# Patient Record
Sex: Female | Born: 1945 | Race: Black or African American | Hispanic: No | Marital: Married | Smoking: Former smoker
Health system: Southern US, Community
[De-identification: ages and names within clinical notes are randomized; demographics above are authoritative.]

## PROBLEM LIST (undated history)

## (undated) DIAGNOSIS — E039 Hypothyroidism, unspecified: Secondary | ICD-10-CM

## (undated) DIAGNOSIS — E119 Type 2 diabetes mellitus without complications: Secondary | ICD-10-CM

## (undated) DIAGNOSIS — I10 Essential (primary) hypertension: Secondary | ICD-10-CM

## (undated) HISTORY — PX: KNEE ARTHROSCOPY: SUR90

## (undated) HISTORY — PX: ULNAR NERVE TRANSPOSITION: SHX2595

## (undated) HISTORY — PX: CHOLECYSTECTOMY: SHX55

## (undated) HISTORY — PX: TUBAL LIGATION: SHX77

---

## 2015-04-24 HISTORY — PX: LUMBAR SPINE SURGERY: SHX701

## 2018-02-05 ENCOUNTER — Other Ambulatory Visit: Payer: Self-pay

## 2018-02-05 ENCOUNTER — Encounter (HOSPITAL_COMMUNITY): Payer: Self-pay

## 2018-02-05 ENCOUNTER — Emergency Department (HOSPITAL_COMMUNITY): Payer: Medicare Other

## 2018-02-05 ENCOUNTER — Inpatient Hospital Stay (HOSPITAL_COMMUNITY)
Admission: EM | Admit: 2018-02-05 | Discharge: 2018-02-09 | DRG: 871 | Disposition: A | Discharge status: Home / Self Care | Payer: Medicare Other | Attending: Family Medicine | Admitting: Family Medicine

## 2018-02-05 DIAGNOSIS — E039 Hypothyroidism, unspecified: Secondary | ICD-10-CM | POA: Diagnosis present

## 2018-02-05 DIAGNOSIS — E119 Type 2 diabetes mellitus without complications: Secondary | ICD-10-CM | POA: Diagnosis not present

## 2018-02-05 DIAGNOSIS — R410 Disorientation, unspecified: Secondary | ICD-10-CM | POA: Diagnosis present

## 2018-02-05 DIAGNOSIS — N179 Acute kidney failure, unspecified: Secondary | ICD-10-CM | POA: Diagnosis present

## 2018-02-05 DIAGNOSIS — Z79890 Hormone replacement therapy (postmenopausal): Secondary | ICD-10-CM

## 2018-02-05 DIAGNOSIS — Z883 Allergy status to other anti-infective agents status: Secondary | ICD-10-CM

## 2018-02-05 DIAGNOSIS — R059 Cough, unspecified: Secondary | ICD-10-CM

## 2018-02-05 DIAGNOSIS — J150 Pneumonia due to Klebsiella pneumoniae: Secondary | ICD-10-CM | POA: Diagnosis present

## 2018-02-05 DIAGNOSIS — R05 Cough: Secondary | ICD-10-CM

## 2018-02-05 DIAGNOSIS — N3001 Acute cystitis with hematuria: Secondary | ICD-10-CM | POA: Diagnosis not present

## 2018-02-05 DIAGNOSIS — E1165 Type 2 diabetes mellitus with hyperglycemia: Secondary | ICD-10-CM | POA: Diagnosis present

## 2018-02-05 DIAGNOSIS — Z79899 Other long term (current) drug therapy: Secondary | ICD-10-CM

## 2018-02-05 DIAGNOSIS — A419 Sepsis, unspecified organism: Secondary | ICD-10-CM | POA: Diagnosis present

## 2018-02-05 DIAGNOSIS — I10 Essential (primary) hypertension: Secondary | ICD-10-CM | POA: Diagnosis present

## 2018-02-05 DIAGNOSIS — Z7984 Long term (current) use of oral hypoglycemic drugs: Secondary | ICD-10-CM

## 2018-02-05 DIAGNOSIS — N12 Tubulo-interstitial nephritis, not specified as acute or chronic: Secondary | ICD-10-CM | POA: Diagnosis present

## 2018-02-05 DIAGNOSIS — N390 Urinary tract infection, site not specified: Secondary | ICD-10-CM | POA: Diagnosis not present

## 2018-02-05 DIAGNOSIS — A4159 Other Gram-negative sepsis: Principal | ICD-10-CM | POA: Diagnosis present

## 2018-02-05 DIAGNOSIS — R509 Fever, unspecified: Secondary | ICD-10-CM

## 2018-02-05 DIAGNOSIS — J13 Pneumonia due to Streptococcus pneumoniae: Secondary | ICD-10-CM | POA: Diagnosis not present

## 2018-02-05 HISTORY — DX: Essential (primary) hypertension: I10

## 2018-02-05 HISTORY — DX: Type 2 diabetes mellitus without complications: E11.9

## 2018-02-05 HISTORY — DX: Hypothyroidism, unspecified: E03.9

## 2018-02-05 LAB — CBC WITH DIFFERENTIAL/PLATELET
ABS IMMATURE GRANULOCYTES: 0.09 10*3/uL — AB (ref 0.00–0.07)
Basophils Absolute: 0 10*3/uL (ref 0.0–0.1)
Basophils Relative: 0 %
EOS ABS: 0 10*3/uL (ref 0.0–0.5)
Eosinophils Relative: 0 %
HEMATOCRIT: 31.9 % — AB (ref 36.0–46.0)
HEMOGLOBIN: 10.4 g/dL — AB (ref 12.0–15.0)
IMMATURE GRANULOCYTES: 1 %
LYMPHS ABS: 1.4 10*3/uL (ref 0.7–4.0)
LYMPHS PCT: 10 %
MCH: 27.5 pg (ref 26.0–34.0)
MCHC: 32.6 g/dL (ref 30.0–36.0)
MCV: 84.4 fL (ref 80.0–100.0)
Monocytes Absolute: 1.6 10*3/uL — ABNORMAL HIGH (ref 0.1–1.0)
Monocytes Relative: 11 %
NEUTROS PCT: 78 %
Neutro Abs: 11.2 10*3/uL — ABNORMAL HIGH (ref 1.7–7.7)
Platelets: 297 10*3/uL (ref 150–400)
RBC: 3.78 MIL/uL — ABNORMAL LOW (ref 3.87–5.11)
RDW: 14 % (ref 11.5–15.5)
WBC: 14.3 10*3/uL — ABNORMAL HIGH (ref 4.0–10.5)
nRBC: 0 % (ref 0.0–0.2)

## 2018-02-05 LAB — INFLUENZA PANEL BY PCR (TYPE A & B)
Influenza A By PCR: NEGATIVE
Influenza B By PCR: NEGATIVE

## 2018-02-05 LAB — GLUCOSE, CAPILLARY: Glucose-Capillary: 310 mg/dL — ABNORMAL HIGH (ref 70–99)

## 2018-02-05 LAB — URINALYSIS, ROUTINE W REFLEX MICROSCOPIC
BILIRUBIN URINE: NEGATIVE
Glucose, UA: >=500 mg/dL — AB
KETONES UR: 5 mg/dL — AB
NITRITE: NEGATIVE
Protein, ur: 100 mg/dL — AB
Specific Gravity, Urine: 1.016 (ref 1.005–1.030)
pH: 5 (ref 5.0–8.0)

## 2018-02-05 LAB — COMPREHENSIVE METABOLIC PANEL
ALBUMIN: 3 g/dL — AB (ref 3.5–5.0)
ALK PHOS: 92 U/L (ref 38–126)
ALT: 21 U/L (ref 0–44)
ANION GAP: 12 (ref 5–15)
AST: 17 U/L (ref 15–41)
BUN: 51 mg/dL — ABNORMAL HIGH (ref 8–23)
CALCIUM: 8.7 mg/dL — AB (ref 8.9–10.3)
CO2: 23 mmol/L (ref 22–32)
Chloride: 100 mmol/L (ref 98–111)
Creatinine, Ser: 1.74 mg/dL — ABNORMAL HIGH (ref 0.44–1.00)
GFR calc Af Amer: 32 mL/min — ABNORMAL LOW (ref >60)
GFR calc non Af Amer: 28 mL/min — ABNORMAL LOW (ref >60)
Glucose, Bld: 360 mg/dL — ABNORMAL HIGH (ref 70–99)
Potassium: 4.2 mmol/L (ref 3.5–5.1)
SODIUM: 135 mmol/L (ref 135–145)
Total Bilirubin: 0.8 mg/dL (ref 0.3–1.2)
Total Protein: 7.1 g/dL (ref 6.5–8.1)

## 2018-02-05 LAB — TROPONIN I

## 2018-02-05 LAB — I-STAT CG4 LACTIC ACID, ED: Lactic Acid, Venous: 0.92 mmol/L (ref 0.5–1.9)

## 2018-02-05 LAB — LIPASE, BLOOD: LIPASE: 38 U/L (ref 11–51)

## 2018-02-05 MED ORDER — LEVOTHYROXINE SODIUM 88 MCG PO TABS
88.0000 ug | ORAL_TABLET | Freq: Every day | ORAL | Status: DC
  Administered 2018-02-06 – 2018-02-09 (×4): 88 ug via ORAL
  Filled 2018-02-05 (×4): qty 1

## 2018-02-05 MED ORDER — ADULT MULTIVITAMIN W/MINERALS CH
1.0000 | ORAL_TABLET | Freq: Every day | ORAL | Status: DC
  Administered 2018-02-06 – 2018-02-09 (×4): 1 via ORAL
  Filled 2018-02-05 (×4): qty 1

## 2018-02-05 MED ORDER — EZETIMIBE 10 MG PO TABS
10.0000 mg | ORAL_TABLET | Freq: Every day | ORAL | Status: DC
  Administered 2018-02-06 – 2018-02-09 (×4): 10 mg via ORAL
  Filled 2018-02-05 (×4): qty 1

## 2018-02-05 MED ORDER — ACETAMINOPHEN 325 MG PO TABS
650.0000 mg | ORAL_TABLET | Freq: Four times a day (QID) | ORAL | Status: DC | PRN
  Administered 2018-02-06 – 2018-02-07 (×3): 650 mg via ORAL
  Filled 2018-02-05 (×3): qty 2

## 2018-02-05 MED ORDER — SIMVASTATIN 40 MG PO TABS
40.0000 mg | ORAL_TABLET | Freq: Every day | ORAL | Status: DC
  Administered 2018-02-05 – 2018-02-08 (×4): 40 mg via ORAL
  Filled 2018-02-05 (×4): qty 1

## 2018-02-05 MED ORDER — GUAIFENESIN-DM 100-10 MG/5ML PO SYRP
5.0000 mL | ORAL_SOLUTION | ORAL | Status: DC | PRN
  Administered 2018-02-06 – 2018-02-08 (×7): 5 mL via ORAL
  Filled 2018-02-05 (×8): qty 10

## 2018-02-05 MED ORDER — ONDANSETRON HCL 4 MG PO TABS: 4.0000 mg | ORAL_TABLET | Freq: Four times a day (QID) | ORAL | Status: DC | PRN

## 2018-02-05 MED ORDER — INSULIN ASPART 100 UNIT/ML ~~LOC~~ SOLN
0.0000 [IU] | Freq: Three times a day (TID) | SUBCUTANEOUS | Status: DC
  Administered 2018-02-06: 11 [IU] via SUBCUTANEOUS
  Administered 2018-02-06: 8 [IU] via SUBCUTANEOUS

## 2018-02-05 MED ORDER — HEPARIN SODIUM (PORCINE) 5000 UNIT/ML IJ SOLN
5000.0000 [IU] | Freq: Three times a day (TID) | INTRAMUSCULAR | Status: DC
  Administered 2018-02-05 – 2018-02-09 (×11): 5000 [IU] via SUBCUTANEOUS
  Filled 2018-02-05 (×11): qty 1

## 2018-02-05 MED ORDER — SODIUM CHLORIDE 0.9 % IV SOLN
INTRAVENOUS | Status: DC
  Administered 2018-02-05 – 2018-02-09 (×6): via INTRAVENOUS

## 2018-02-05 MED ORDER — INSULIN ASPART 100 UNIT/ML ~~LOC~~ SOLN
5.0000 [IU] | Freq: Once | SUBCUTANEOUS | Status: AC
  Administered 2018-02-05: 5 [IU] via SUBCUTANEOUS

## 2018-02-05 MED ORDER — SODIUM CHLORIDE 0.9 % IV SOLN
1.0000 g | Freq: Once | INTRAVENOUS | Status: DC
  Filled 2018-02-05: qty 10

## 2018-02-05 MED ORDER — ONDANSETRON HCL 4 MG/2ML IJ SOLN: 4.0000 mg | Freq: Four times a day (QID) | INTRAMUSCULAR | Status: DC | PRN

## 2018-02-05 MED ORDER — SODIUM CHLORIDE 0.9 % IV SOLN
1.0000 g | INTRAVENOUS | Status: DC
  Administered 2018-02-05: 1 g via INTRAVENOUS
  Filled 2018-02-05: qty 10

## 2018-02-05 MED ORDER — ACETAMINOPHEN 650 MG RE SUPP: 650.0000 mg | Freq: Four times a day (QID) | RECTAL | Status: DC | PRN

## 2018-02-05 MED ORDER — SODIUM CHLORIDE 0.9 % IV BOLUS
1000.0000 mL | Freq: Once | INTRAVENOUS | Status: AC
  Administered 2018-02-05: 1000 mL via INTRAVENOUS

## 2018-02-05 NOTE — H&P (Signed)
History and Physical    Patty Jones ONG:[id number] DOB: 09-29-1945 DOA: 02/05/2018  PCP: System, Pcp Not In  Patient coming from: Home  I have personally briefly reviewed patient's old medical records in [facility]  Chief Complaint: Fever, fatigue  HPI: Patty Jones is a 72 y.o. female with medical history significant of DM2, HTN.  Patient presents to the ED for evaluation of fever, fatigue, AMS described as slow to respond.  Decreased PO intake since Friday.  No dysuria.  Not tried any meds at home for fever. BGLs in the 300s for past 2 days.  Chronic cough that seems worse since symptom onset.   ED Course: CXR neg, but UA demonstrates likely UTI with pyuria, many bacteremia, large LE, neg nitrites.  Creat of 1.7 BUN 51.  WBC 14.3.  Tm 101.5  No flank, back, nor abd pain.   Review of Systems: As per HPI otherwise 10 point review of systems negative.   Past Medical History:  Diagnosis Date  . Diabetes mellitus without complication (HCC)   . HTN (hypertension)   . Hypothyroidism     Past Surgical History:  Procedure Laterality Date  . CESAREAN SECTION     X3  . CHOLECYSTECTOMY    . KNEE ARTHROSCOPY    . LUMBAR SPINE SURGERY  2017   L4-L5 XLIF  . TUBAL LIGATION    . ULNAR NERVE TRANSPOSITION       has no tobacco, alcohol, and drug history on file.  No Known Allergies  Family History  Problem Relation Age of Onset  . Breast cancer Mother   . Diabetes Mellitus II Mother   . Prostate cancer Brother      Prior to Admission medications   Medication Sig Start Date End Date Taking? Authorizing Provider  ezetimibe (ZETIA) 10 MG tablet Take 10 mg by mouth daily. 01/18/18  Yes [provider]  glipiZIDE (GLUCOTROL XL) 5 MG 24 hr tablet Take 10 mg by mouth daily. 01/18/18  Yes [provider]  levothyroxine (SYNTHROID, LEVOTHROID) 88 MCG tablet Take 88 mcg by mouth daily. 11/26/17  Yes [provider]  lisinopril (PRINIVIL,ZESTRIL) 20  MG tablet Take 20 mg by mouth daily. 12/26/17  Yes [provider]  meloxicam (MOBIC) 7.5 MG tablet Take 7.5 mg by mouth daily. 01/28/18  Yes [provider]  metFORMIN (GLUCOPHAGE) 500 MG tablet Take 500 mg by mouth 2 (two) times daily. 11/21/17  Yes [provider]  Misc Natural Products (JOINT SUPPORT) CAPS Take 1 capsule by mouth daily.   Yes [provider]  Multiple Vitamins-Minerals (MULTIVITAMIN WOMEN 50+) TABS Take 1 tablet by mouth daily.   Yes [provider]  Omega-3 Fatty Acids (FISH OIL) 1000 MG CAPS Take 1,000 mg by mouth daily.   Yes [provider]  simvastatin (ZOCOR) 40 MG tablet Take 40 mg by mouth at bedtime. 12/28/17  Yes [provider]  TRADJENTA 5 MG TABS tablet Take 5 mg by mouth daily. 11/25/17  Yes [provider]    Physical Exam: Vitals:   02/05/18 2014 02/05/18 2243  BP: 132/64 122/64  Pulse: (!) 106 91  Resp: 18 (!) 22  Temp: (!) 101.5 F (38.6 C) 99.1 F (37.3 C)  TempSrc: Oral Oral  SpO2: 93% 94%    Constitutional: NAD, calm, comfortable Eyes: PERRL, lids and conjunctivae normal ENMT: Mucous membranes are moist. Posterior pharynx clear of any exudate or lesions.Normal dentition.  Neck: normal, supple, no masses, no thyromegaly Respiratory:  clear to auscultation bilaterally, no wheezing, no crackles. Normal respiratory effort. No accessory muscle use.  Cardiovascular: Regular rate and rhythm, no murmurs / rubs / gallops. No extremity edema. 2+ pedal pulses. No carotid bruits.  Abdomen: no tenderness, no masses palpated. No hepatosplenomegaly. Bowel sounds positive.  Musculoskeletal: no clubbing / cyanosis. No joint deformity upper and lower extremities. Good ROM, no contractures. Normal muscle tone.  Skin: no rashes, lesions, ulcers. No induration Neurologic: CN 2-12 grossly intact. Sensation intact, DTR normal. Strength 5/5 in all 4.  Psychiatric: Normal judgment and insight. Alert and  oriented x 3. Normal mood.    Labs on Admission: I have personally reviewed following labs and imaging studies  CBC: Recent Labs  Lab 02/05/18 2056  WBC 14.3*  NEUTROABS 11.2*  HGB 10.4*  HCT 31.9*  MCV 84.4  PLT 297   Basic Metabolic Panel: Recent Labs  Lab 02/05/18 2056  NA 135  K 4.2  CL 100  CO2 23  GLUCOSE 360*  BUN 51*  CREATININE 1.74*  CALCIUM 8.7*   GFR: CrCl cannot be calculated (Unknown ideal weight.). Liver Function Tests: Recent Labs  Lab 02/05/18 2056  AST 17  ALT 21  ALKPHOS 92  BILITOT 0.8  PROT 7.1  ALBUMIN 3.0*   Recent Labs  Lab 02/05/18 2056  LIPASE 38   No results for input(s): AMMONIA in the last 168 hours. Coagulation Profile: No results for input(s): INR, PROTIME in the last 168 hours. Cardiac Enzymes: Recent Labs  Lab 02/05/18 2056  TROPONINI <0.03   BNP (last 3 results) No results for input(s): PROBNP in the last 8760 hours. HbA1C: No results for input(s): HGBA1C in the last 72 hours. CBG: No results for input(s): GLUCAP in the last 168 hours. Lipid Profile: No results for input(s): CHOL, HDL, LDLCALC, TRIG, CHOLHDL, LDLDIRECT in the last 72 hours. Thyroid Function Tests: No results for input(s): TSH, T4TOTAL, FREET4, T3FREE, THYROIDAB in the last 72 hours. Anemia Panel: No results for input(s): VITAMINB12, FOLATE, FERRITIN, TIBC, IRON, RETICCTPCT in the last 72 hours. Urine analysis:    Component Value Date/Time   COLORURINE AMBER (A) 02/05/2018 2130   APPEARANCEUR CLOUDY (A) 02/05/2018 2130   LABSPEC 1.016 02/05/2018 2130   PHURINE 5.0 02/05/2018 2130   GLUCOSEU >=500 (A) 02/05/2018 2130   HGBUR LARGE (A) 02/05/2018 2130   BILIRUBINUR NEGATIVE 02/05/2018 2130   KETONESUR 5 (A) 02/05/2018 2130   PROTEINUR 100 (A) 02/05/2018 2130   NITRITE NEGATIVE 02/05/2018 2130   LEUKOCYTESUR LARGE (A) 02/05/2018 2130    Radiological Exams on Admission: Dg Chest 2 View  Result Date: 02/05/2018 CLINICAL DATA:  Fever  and cough with lethargy since Friday. EXAM: CHEST - 2 VIEW COMPARISON:  None. FINDINGS: Low lung volumes with bibasilar subsegmental atelectasis. Heart is normal in size. Mild aortic atherosclerosis is noted at the arch without aneurysm. No acute osseous abnormality is seen. IMPRESSION: Low lung volumes with streaky subsegmental atelectasis noted at each base. Electronically Signed   By: Tollie Eth M.D.   On: 02/05/2018 21:58    EKG: Independently reviewed.  Assessment/Plan Principal Problem:   Sepsis secondary to UTI [facility]) Active Problems:   AKI (acute kidney injury) (HCC)   DM2 (diabetes mellitus, type 2) (HCC)   HTN (hypertension)    1. Sepsis secondary to UTI - 1. Rocephin 2. UCx pending 3. IVF: 1L bolus in ED and NS at 125 cc/hr 4. Repeat CBC/BMP in AM 2. AKI 1. Hold lisinopril 2. IVF as above  3. Intake and output 4. Repeat BMP in AM 3. DM2 - 1. Hold PO hypoglycemics 2. Mod scale SSI AC 4. HTN - 1. Holding lisinopril  DVT prophylaxis: Heparin [place] Code Status: Full Family Communication: Family at bedside Disposition Plan: home after admit Consults called: None Admission status: Admit to inpatient  Severity of Illness: The appropriate patient status for this patient is INPATIENT. Inpatient status is judged to be reasonable and necessary in order to provide the required intensity of service to ensure the patient's safety. The patient's presenting symptoms, physical exam findings, and initial radiographic and laboratory data in the context of their chronic comorbidities is felt to place them at high risk for further clinical deterioration. Furthermore, it is not anticipated that the patient will be medically stable for discharge from the hospital within 2 midnights of admission. The following factors support the patient status of inpatient.   " The patient's presenting symptoms include AMS, fever. " The worrisome physical exam findings include AMS, slower to respond, fever,  tachycardia. " The initial radiographic and laboratory data are worrisome because of UTI, AKI, leukocytosis. " The chronic co-morbidities include DM2, HTN.   * I certify that at the point of admission it is my clinical judgment that the patient will require inpatient hospital care spanning beyond 2 midnights from the point of admission due to high intensity of service, high risk for further deterioration and high frequency of surveillance required.Hillary Bow DO [facility] Pager [phone] Only works nights!  If 7AM-7PM, please contact the primary day team physician taking care of patient  [email or web address] Password [email or web address]  02/05/2018, 11:02 PM

## 2018-02-05 NOTE — ED Notes (Signed)
Spoke with provider long, verbal to d/c 2nd lactic acid

## 2018-02-05 NOTE — ED Provider Notes (Signed)
Emergency Department Provider Note   I have reviewed the triage vital signs and the nursing notes.   HISTORY  Chief Complaint Fever; Fatigue; and Loss of Appetite   HPI Patty Jones is a 72 y.o. female with PMH of DM presents to the emergency department for evaluation of fever, fatigue, and cough.  Family states that the patient has been more slow to answer and fatigued.  He states she has not been eating since Friday.  The patient denies any abdominal pain, chest pain, or shortness of breath.  She denies any UTI symptoms.  She has not been taking Tylenol or Motrin for fever.  Family noticed that her blood sugars have been elevated to the 300s over the past 2 days. They note that the patient has a chronic cough but patient states this seems worse since fever onset.    Past Medical History:  Diagnosis Date  . Diabetes mellitus without complication (HCC)   . HTN (hypertension)   . Hypothyroidism     Patient Active Problem List   Diagnosis Date Noted  . Sepsis secondary to UTI (HCC) 02/05/2018  . AKI (acute kidney injury) (HCC) 02/05/2018  . DM2 (diabetes mellitus, type 2) (HCC) 02/05/2018  . HTN (hypertension) 02/05/2018    Past Surgical History:  Procedure Laterality Date  . CESAREAN SECTION     X3  . CHOLECYSTECTOMY    . KNEE ARTHROSCOPY    . LUMBAR SPINE SURGERY  2017   L4-L5 XLIF  . TUBAL LIGATION    . ULNAR NERVE TRANSPOSITION       Allergies Patient has no known allergies.  Family History  Problem Relation Age of Onset  . Breast cancer Mother   . Diabetes Mellitus II Mother   . Prostate cancer Brother     Social History Social History   Tobacco Use  . Smoking status: Not on file  Substance Use Topics  . Alcohol use: Not on file  . Drug use: Not on file    Review of Systems  Constitutional: Positive fever/chills. Positive fatigue and generalized weakness.  Eyes: No visual changes. ENT: No sore throat. Cardiovascular: Denies chest  pain. Respiratory: Denies shortness of breath. Positive cough.  Gastrointestinal: No abdominal pain.  No nausea, no vomiting.  No diarrhea.  No constipation. Patient with loss of appetite.  Genitourinary: Negative for dysuria. Musculoskeletal: Negative for back pain. Skin: Negative for rash. Neurological: Negative for headaches, focal weakness or numbness.  10-point ROS otherwise negative.  ____________________________________________   PHYSICAL EXAM:  VITAL SIGNS: ED Triage Vitals  Enc Vitals Group     BP 02/05/18 2014 132/64     Pulse Rate 02/05/18 2014 (!) 106     Resp 02/05/18 2014 18     Temp 02/05/18 2014 (!) 101.5 F (38.6 C)     Temp Source 02/05/18 2014 Oral     SpO2 02/05/18 2014 93 %     Pain Score 02/05/18 2021 0    Constitutional: Alert and oriented. Patient appears fatigued but able to provide a full and appropriate history.  Eyes: Conjunctivae are normal.  Head: Atraumatic. Nose: No congestion/rhinnorhea. Mouth/Throat: Mucous membranes are dry. Oropharynx non-erythematous. Neck: No stridor.  No meningeal signs.  Cardiovascular: Tachycardia. Good peripheral circulation. Grossly normal heart sounds.   Respiratory: Normal respiratory effort.  No retractions. Lungs CTAB. Gastrointestinal: Soft and nontender. No distention.  Musculoskeletal: No lower extremity tenderness nor edema. No gross deformities of extremities. Neurologic:  Normal speech and language. No gross focal  neurologic deficits are appreciated.  Skin:  Skin is warm, dry and intact. No rash noted.  ____________________________________________   LABS (all labs ordered are listed, but only abnormal results are displayed)  Labs Reviewed  COMPREHENSIVE METABOLIC PANEL - Abnormal; Notable for the following components:      Result Value   Glucose, Bld 360 (*)    BUN 51 (*)    Creatinine, Ser 1.74 (*)    Calcium 8.7 (*)    Albumin 3.0 (*)    GFR calc non Af Amer 28 (*)    GFR calc Af Amer 32 (*)     All other components within normal limits  CBC WITH DIFFERENTIAL/PLATELET - Abnormal; Notable for the following components:   WBC 14.3 (*)    RBC 3.78 (*)    Hemoglobin 10.4 (*)    HCT 31.9 (*)    Neutro Abs 11.2 (*)    Monocytes Absolute 1.6 (*)    Abs Immature Granulocytes 0.09 (*)    All other components within normal limits  URINALYSIS, ROUTINE W REFLEX MICROSCOPIC - Abnormal; Notable for the following components:   Color, Urine AMBER (*)    APPearance CLOUDY (*)    Glucose, UA >=500 (*)    Hgb urine dipstick LARGE (*)    Ketones, ur 5 (*)    Protein, ur 100 (*)    Leukocytes, UA LARGE (*)    RBC / HPF >50 (*)    WBC, UA >50 (*)    Bacteria, UA MANY (*)    All other components within normal limits  CULTURE, BLOOD (ROUTINE X 2)  CULTURE, BLOOD (ROUTINE X 2)  URINE CULTURE  LIPASE, BLOOD  TROPONIN I  INFLUENZA PANEL BY PCR (TYPE A & B)  BASIC METABOLIC PANEL  CBC  I-STAT CG4 LACTIC ACID, ED   ____________________________________________  EKG   EKG Interpretation  Date/Time:  Wednesday February 05 2018 21:08:30 EDT Ventricular Rate:  96 PR Interval:    QRS Duration: 82 QT Interval:  341 QTC Calculation: 431 R Axis:   58 Text Interpretation:  Sinus rhythm Abnormal R-wave progression, early transition No STEMI.  Confirmed by Alona Bene [phone]) on 02/05/2018 9:35:49 PM       ____________________________________________  RADIOLOGY  Dg Chest 2 View  Result Date: 02/05/2018 CLINICAL DATA:  Fever and cough with lethargy since Friday. EXAM: CHEST - 2 VIEW COMPARISON:  None. FINDINGS: Low lung volumes with bibasilar subsegmental atelectasis. Heart is normal in size. Mild aortic atherosclerosis is noted at the arch without aneurysm. No acute osseous abnormality is seen. IMPRESSION: Low lung volumes with streaky subsegmental atelectasis noted at each base. Electronically Signed   By: Tollie Eth M.D.   On: 02/05/2018 21:58     ____________________________________________   PROCEDURES  Procedure(s) performed:   Procedures  None ____________________________________________   INITIAL IMPRESSION / ASSESSMENT AND PLAN / ED COURSE  Pertinent labs & imaging results that were available during my care of the patient were reviewed by me and considered in my medical decision making (see chart for details).  Patient presents to the emergency department for evaluation of fever, fatigue, cough, poor appetite.  No clear section source on exam.  She does not have a rash or ulcerations.  Plan for septic work-up.  Will add influenza testing with cough.  Flu negative. Labs with leukocytosis and likely AKI. UA with UTI. Given fatigue and AKI plan for admit. Patient given 1L IVF bolus. No sepsis.   Discussed patient's case with  Hospitalist, Dr. Julian Reil to request admission. Patient and family (if present) updated with plan. Care transferred to Hospitalist service.  I reviewed all nursing notes, vitals, pertinent old records, EKGs, labs, imaging (as available).   ____________________________________________  FINAL CLINICAL IMPRESSION(S) / ED DIAGNOSES  Final diagnoses:  Acute cystitis with hematuria  Fever, unspecified fever cause     MEDICATIONS GIVEN DURING THIS VISIT:  Medications  insulin aspart (novoLOG) injection 0-15 Units (has no administration in time range)  levothyroxine (SYNTHROID, LEVOTHROID) tablet 88 mcg (has no administration in time range)  ezetimibe (ZETIA) tablet 10 mg (has no administration in time range)  MULTIVITAMIN WOMEN 50+ TABS 1 tablet (has no administration in time range)  simvastatin (ZOCOR) tablet 40 mg (has no administration in time range)  cefTRIAXone (ROCEPHIN) 1 g in sodium chloride 0.9 % 100 mL IVPB (1 g Intravenous New Bag/Given 02/05/18 2240)  0.9 %  sodium chloride infusion (has no administration in time range)  acetaminophen (TYLENOL) tablet 650 mg (has no  administration in time range)    Or  acetaminophen (TYLENOL) suppository 650 mg (has no administration in time range)  ondansetron (ZOFRAN) tablet 4 mg (has no administration in time range)    Or  ondansetron (ZOFRAN) injection 4 mg (has no administration in time range)  heparin injection 5,000 Units (has no administration in time range)  sodium chloride 0.9 % bolus 1,000 mL (1,000 mLs Intravenous New Bag/Given 02/05/18 2110)    Note:  This document was prepared using Dragon voice recognition software and may include unintentional dictation errors.  Alona Bene, MD Emergency Medicine    Long, Arlyss Repress, MD 02/05/18 [phone]

## 2018-02-05 NOTE — ED Notes (Signed)
Patient transported to X-ray 

## 2018-02-05 NOTE — ED Triage Notes (Signed)
Pt daughter reports patient arrived at her house today and has been lethargic, febrile, and not eating since Friday. Patient is lethargic and slow to answer in triage. Daughter reports a Tmax of 103 at home. She states that she did not give her mother tylenol because she is a diabetic and she "doesn't just give her anything." Pt A&Ox3.

## 2018-02-05 NOTE — ED Notes (Signed)
ED TO INPATIENT HANDOFF REPORT  Name/Age/Gender Patty Jones 72 y.o. female  Code Status    Code Status Orders  (From admission, onward)         Start     Ordered   02/05/18 2233  Full code  Continuous     02/05/18 2233        Code Status History    This patient has a current code status but no historical code status.      Home/SNF/Other Home  Chief Complaint [phone]); Hyperglycemia; Not Eating  Level of Care/Admitting Diagnosis ED Disposition    ED Disposition Condition Comment   Admit  Hospital Area: [place] [[id number]]  Level of Care: Med-Surg [16]  Diagnosis: Sepsis secondary to UTI [facility]) [863817]  Admitting Physician: Doreatha Massed  Attending Physician: Etta Quill [phone]  Estimated length of stay: past midnight tomorrow  Certification:: I certify this patient will need inpatient services for at least 2 midnights  PT Class (Do Not Modify): Inpatient [101]  PT Acc Code (Do Not Modify): Private [1]       Medical History Past Medical History:  Diagnosis Date  . Diabetes mellitus without complication ([place])   . HTN (hypertension)   . Hypothyroidism     Allergies No Known Allergies  IV Location/Drains/Wounds Patient Lines/Drains/Airways Status   Active Line/Drains/Airways    Name:   Placement date:   Placement time:   Site:   Days:   Peripheral IV 02/05/18 Right Forearm   02/05/18    2049    Forearm   less than 1          Labs/Imaging Results for orders placed or performed during the hospital encounter of 02/05/18 (from the past 48 hour(s))  I-Stat CG4 Lactic Acid, ED  (not at  [facility])     Status: None   Collection Time: 02/05/18  8:52 PM  Result Value Ref Range   Lactic Acid, Venous 0.92 0.5 - 1.9 mmol/L  Comprehensive metabolic panel     Status: Abnormal   Collection Time: 02/05/18  8:56 PM  Result Value Ref Range   Sodium 135 135 - 145 mmol/L   Potassium 4.2 3.5 - 5.1 mmol/L   Chloride 100 98 - 111  mmol/L   CO2 23 22 - 32 mmol/L   Glucose, Bld 360 (H) 70 - 99 mg/dL   BUN 51 (H) 8 - 23 mg/dL   Creatinine, Ser 1.74 (H) 0.44 - 1.00 mg/dL   Calcium 8.7 (L) 8.9 - 10.3 mg/dL   Total Protein 7.1 6.5 - 8.1 g/dL   Albumin 3.0 (L) 3.5 - 5.0 g/dL   AST 17 15 - 41 U/L   ALT 21 0 - 44 U/L   Alkaline Phosphatase 92 38 - 126 U/L   Total Bilirubin 0.8 0.3 - 1.2 mg/dL   GFR calc non Af Amer 28 (L) >60 mL/min   GFR calc Af Amer 32 (L) >60 mL/min    Comment: (NOTE) The eGFR has been calculated using the CKD EPI equation. This calculation has not been validated in all clinical situations. eGFR's persistently <60 mL/min signify possible Chronic Kidney Disease.    Anion gap 12 5 - 15    Comment: Performed at [facility], [place] [address] [place], [place] [address]  CBC WITH DIFFERENTIAL     Status: Abnormal   Collection Time: 02/05/18  8:56 PM  Result Value Ref Range   WBC 14.3 (H) 4.0 - 10.5  K/uL   RBC 3.78 (L) 3.87 - 5.11 MIL/uL   Hemoglobin 10.4 (L) 12.0 - 15.0 g/dL   HCT 31.9 (L) 36.0 - 46.0 %   MCV 84.4 80.0 - 100.0 fL   MCH 27.5 26.0 - 34.0 pg   MCHC 32.6 30.0 - 36.0 g/dL   RDW 14.0 11.5 - 15.5 %   Platelets 297 150 - 400 K/uL   nRBC 0.0 0.0 - 0.2 %   Neutrophils Relative % 78 %   Neutro Abs 11.2 (H) 1.7 - 7.7 K/uL   Lymphocytes Relative 10 %   Lymphs Abs 1.4 0.7 - 4.0 K/uL   Monocytes Relative 11 %   Monocytes Absolute 1.6 (H) 0.1 - 1.0 K/uL   Eosinophils Relative 0 %   Eosinophils Absolute 0.0 0.0 - 0.5 K/uL   Basophils Relative 0 %   Basophils Absolute 0.0 0.0 - 0.1 K/uL   Immature Granulocytes 1 %   Abs Immature Granulocytes 0.09 (H) 0.00 - 0.07 K/uL    Comment: Performed at [facility], [place] [address] [place], [place] [address]  Lipase, blood     Status: None   Collection Time: 02/05/18  8:56 PM  Result Value Ref Range   Lipase 38 11 - 51 U/L    Comment: Performed at [facility], [place] [address],  [place], [place] [address]  Troponin I     Status: None   Collection Time: 02/05/18  8:56 PM  Result Value Ref Range   Troponin I <0.03 <0.03 ng/mL    Comment: Performed at [facility], [place] [address] [place], [place] [address]  Influenza panel by PCR (type A & B)     Status: None   Collection Time: 02/05/18  9:10 PM  Result Value Ref Range   Influenza A By PCR NEGATIVE NEGATIVE   Influenza B By PCR NEGATIVE NEGATIVE    Comment: (NOTE) The Xpert Xpress Flu assay is intended as an aid in the diagnosis of  influenza and should not be used as a sole basis for treatment.  This  assay is FDA approved for nasopharyngeal swab specimens only. Nasal  washings and aspirates are unacceptable for Xpert Xpress Flu testing. Performed at [facility], [place] [address] [place], [place] [address]   Urinalysis, Routine w reflex microscopic     Status: Abnormal   Collection Time: 02/05/18  9:30 PM  Result Value Ref Range   Color, Urine AMBER (A) YELLOW    Comment: BIOCHEMICALS MAY BE AFFECTED BY COLOR   APPearance CLOUDY (A) CLEAR   Specific Gravity, Urine 1.016 1.005 - 1.030   pH 5.0 5.0 - 8.0   Glucose, UA >=500 (A) NEGATIVE mg/dL   Hgb urine dipstick LARGE (A) NEGATIVE   Bilirubin Urine NEGATIVE NEGATIVE   Ketones, ur 5 (A) NEGATIVE mg/dL   Protein, ur 100 (A) NEGATIVE mg/dL   Nitrite NEGATIVE NEGATIVE   Leukocytes, UA LARGE (A) NEGATIVE   RBC / HPF >50 (H) 0 - 5 RBC/hpf   WBC, UA >50 (H) 0 - 5 WBC/hpf   Bacteria, UA MANY (A) NONE SEEN   WBC Clumps PRESENT    Mucus PRESENT     Comment: Performed at [facility], [place] [address] [place], [place] [address]   Dg Chest 2 View  Result Date: 02/05/2018 CLINICAL DATA:  Fever and cough with lethargy since Friday. EXAM: CHEST - 2 VIEW COMPARISON:  None. FINDINGS: Low lung volumes with  bibasilar subsegmental atelectasis. Heart is normal in size. Mild aortic atherosclerosis is noted at the arch  without aneurysm. No acute osseous abnormality is seen. IMPRESSION: Low lung volumes with streaky subsegmental atelectasis noted at each base. Electronically Signed   By: Ashley Royalty M.D.   On: 02/05/2018 21:58    Pending Labs Unresulted Labs (From admission, onward)    Start     Ordered   02/06/18 6389  Basic metabolic panel  Tomorrow morning,   R     02/05/18 2230   02/06/18 0500  CBC  Tomorrow morning,   R     02/05/18 2230   02/05/18 2034  Urine culture  STAT,   STAT    Question:  Patient immune status  Answer:  Normal   02/05/18 2033   02/05/18 2033  Blood Culture (routine x 2)  BLOOD CULTURE X 2,   STAT    Question:  Patient immune status  Answer:  Normal   02/05/18 2033          Vitals/Pain Today's Vitals   02/05/18 2014 02/05/18 2021 02/05/18 2124  BP: 132/64    Pulse: (!) 106    Resp: 18    Temp: (!) 101.5 F (38.6 C)    TempSrc: Oral    SpO2: 93%    PainSc:  0-No pain 0-No pain    Isolation Precautions Droplet precaution  Medications Medications  insulin aspart (novoLOG) injection 0-15 Units (has no administration in time range)  levothyroxine (SYNTHROID, LEVOTHROID) tablet 88 mcg (has no administration in time range)  ezetimibe (ZETIA) tablet 10 mg (has no administration in time range)  MULTIVITAMIN WOMEN 50+ TABS 1 tablet (has no administration in time range)  simvastatin (ZOCOR) tablet 40 mg (has no administration in time range)  cefTRIAXone (ROCEPHIN) 1 g in sodium chloride 0.9 % 100 mL IVPB (has no administration in time range)  0.9 %  sodium chloride infusion (has no administration in time range)  acetaminophen (TYLENOL) tablet 650 mg (has no administration in time range)    Or  acetaminophen (TYLENOL) suppository 650 mg (has no administration in time range)  ondansetron (ZOFRAN) tablet 4 mg (has no administration in time range)    Or  ondansetron (ZOFRAN) injection 4 mg (has no administration in time range)  heparin injection 5,000 Units (has no  administration in time range)  sodium chloride 0.9 % bolus 1,000 mL (1,000 mLs Intravenous New Bag/Given 02/05/18 2110)    Mobility manual wheelchair (due to weakness)

## 2018-02-06 ENCOUNTER — Inpatient Hospital Stay (HOSPITAL_COMMUNITY): Payer: Medicare Other

## 2018-02-06 ENCOUNTER — Encounter (HOSPITAL_COMMUNITY): Payer: Self-pay | Admitting: *Deleted

## 2018-02-06 LAB — GLUCOSE, CAPILLARY
Glucose-Capillary: 263 mg/dL — ABNORMAL HIGH (ref 70–99)
Glucose-Capillary: 323 mg/dL — ABNORMAL HIGH (ref 70–99)
Glucose-Capillary: 302 mg/dL — ABNORMAL HIGH (ref 70–99)
Glucose-Capillary: 186 mg/dL — ABNORMAL HIGH (ref 70–99)

## 2018-02-06 LAB — BASIC METABOLIC PANEL
ANION GAP: 11 (ref 5–15)
BUN: 47 mg/dL — ABNORMAL HIGH (ref 8–23)
CALCIUM: 8.3 mg/dL — AB (ref 8.9–10.3)
CO2: 22 mmol/L (ref 22–32)
Chloride: 105 mmol/L (ref 98–111)
Creatinine, Ser: 1.57 mg/dL — ABNORMAL HIGH (ref 0.44–1.00)
GFR calc non Af Amer: 31 mL/min — ABNORMAL LOW (ref >60)
GFR, EST AFRICAN AMERICAN: 36 mL/min — AB (ref >60)
GLUCOSE: 273 mg/dL — AB (ref 70–99)
Potassium: 4.2 mmol/L (ref 3.5–5.1)
Sodium: 138 mmol/L (ref 135–145)

## 2018-02-06 LAB — CBC
HCT: 29.7 % — ABNORMAL LOW (ref 36.0–46.0)
Hemoglobin: 9.6 g/dL — ABNORMAL LOW (ref 12.0–15.0)
MCH: 28 pg (ref 26.0–34.0)
MCHC: 32.3 g/dL (ref 30.0–36.0)
MCV: 86.6 fL (ref 80.0–100.0)
PLATELETS: 284 10*3/uL (ref 150–400)
RBC: 3.43 MIL/uL — AB (ref 3.87–5.11)
RDW: 14.3 % (ref 11.5–15.5)
WBC: 14.6 10*3/uL — ABNORMAL HIGH (ref 4.0–10.5)
nRBC: 0 % (ref 0.0–0.2)

## 2018-02-06 MED ORDER — INSULIN ASPART 100 UNIT/ML ~~LOC~~ SOLN
0.0000 [IU] | Freq: Three times a day (TID) | SUBCUTANEOUS | Status: DC
  Administered 2018-02-06: 15 [IU] via SUBCUTANEOUS
  Administered 2018-02-07: 11 [IU] via SUBCUTANEOUS
  Administered 2018-02-07: 15 [IU] via SUBCUTANEOUS
  Administered 2018-02-07: 11 [IU] via SUBCUTANEOUS
  Administered 2018-02-08 (×2): 7 [IU] via SUBCUTANEOUS
  Administered 2018-02-08: 15 [IU] via SUBCUTANEOUS
  Administered 2018-02-09 (×2): 7 [IU] via SUBCUTANEOUS

## 2018-02-06 MED ORDER — SODIUM CHLORIDE 0.9 % IV SOLN
1.0000 g | Freq: Two times a day (BID) | INTRAVENOUS | Status: DC
  Administered 2018-02-06 – 2018-02-09 (×6): 1 g via INTRAVENOUS
  Filled 2018-02-06 (×7): qty 1

## 2018-02-06 MED ORDER — ENSURE ENLIVE PO LIQD
237.0000 mL | Freq: Two times a day (BID) | ORAL | Status: DC
  Administered 2018-02-06 – 2018-02-07 (×3): 237 mL via ORAL

## 2018-02-06 MED ORDER — INSULIN GLARGINE 100 UNIT/ML ~~LOC~~ SOLN
10.0000 [IU] | Freq: Every day | SUBCUTANEOUS | Status: DC
  Administered 2018-02-06 – 2018-02-08 (×3): 10 [IU] via SUBCUTANEOUS
  Filled 2018-02-06 (×4): qty 0.1

## 2018-02-06 NOTE — Progress Notes (Signed)
Pharmacy Antibiotic Note  Patty Jones is a 72 y.o. female admitted on 02/05/2018 with sepsis secondary to UTI. Initially started on ceftriaxone for UTI.  Now broadening coverage to meropenem, Pharmacy has been consulted for dosing.  Plan: Meropenem 1g IV q12h Follow up renal function & cultures, de-escalate as appropriate    Temp (24hrs), Avg:100.4 F (38 C), Min:98.8 F (37.1 C), Max:103 F (39.4 C)  Recent Labs  Lab 02/05/18 2052 02/05/18 2056 02/06/18 0557  WBC  --  14.3* 14.6*  CREATININE  --  1.74* 1.57*  LATICACIDVEN 0.92  --   --     CrCl cannot be calculated (Unknown ideal weight.).    No Known Allergies  Antimicrobials this admission:  10/16 CTX >> 10/17 10/17 Meropenem >>  Dose adjustments this admission:   Microbiology results:  10/16 BCx: ngtd 10/16 UCx: sent (UA >50 WBC, large leuk, many bacteria) 10/16 Influenza: neg  Thank you for allowing pharmacy to be a part of this patient's care.  Loralee Pacas, PharmD, BCPS Pager: [phone] 02/06/2018 5:20 PM

## 2018-02-06 NOTE — Progress Notes (Addendum)
Pt increasingly more lethargic. Pt also diaphoretic, increased RR and temp. MD on floor and notified. MD assessed pt and new orders placed. MEWS protocol for VS followed.  Justin Mend, RN

## 2018-02-06 NOTE — Progress Notes (Signed)
Nutrition Brief Note  Patient identified on the Malnutrition Screening Tool (MST) Report  Wt Readings from Last 15 Encounters:  No data found for [facility]  Per charts from [facility]: 10/11: 160 lb April 2018: 162 lb  No significant weight changes. Pt has not been eating since Friday 10/11. Prior to that patient with no issues eating. Ate 45% of breakfast this morning. Ensure supplements have been ordered per ONS.  BMI: 29.3 kg/m^2. Patient meets criteria for overweight based on current BMI.   Current diet order is CHO modified. Labs and medications reviewed.   No further nutrition interventions warranted at this time. If nutrition issues arise, please consult RD.   Tilda Franco, MS, RD, LDN [facility] Inpatient Clinical Dietitian Pager: [phone] After Hours Pager: [phone]

## 2018-02-06 NOTE — Progress Notes (Addendum)
PROGRESS NOTE [facility]   Shatisha Falter   ZOX:[id number] DOB: [birth year]  DOA: 02/05/2018 PCP: System, Pcp Not In   Brief Narrative:  Patty Jones is a 72 year old female with medical history significant of diabetes mellitus type 2 and hypertension, presented to the emergency department complaining of fever, fatigue and confusion.  Apparently patient have decreased p.o. intake since Friday with intermittent fevers and elevated blood glucose.  Upon ED evaluation chest x-ray was negative, UA was grossly abnormal and lab work-up reveals creatinine of 1.7 BUN 51 WBC 14.3.  Patient was afebrile T-max 101.5.  Patient was admitted with working diagnosis of sepsis secondary to UTI.  Subjective: Patient seen and examined, she reported feeling much better however still febrile this morning.  Mental status back to baseline.  Denies nausea, vomiting, abdominal pain and dysuria.  Assessment & Plan: Sepsis secondary to UTI Sepsis physiology has improved, UA grossly abnormal concerning for UTI.  Patient currently on Rocephin, urine culture pending.  Continue hydration and IV antibiotics.  Await for urine culture to de-escalate antibiotic therapy.  Monitor CBC and BMP in a.m.  Addendum 5:15 PM  Patient with persistent high fevers, blood cultures for far no grow, urine cx pending, will broaden abx therapy, start Meropenem pending urine cultures, d/c rocephin. Get CT abd/pelvis r/o pyelo. Will get CXR, as patient is currently tachypnic and has new RLL rales.     AKI Likely secondary to poor oral intake and infectious process.  Creatinine improved after IV hydration.  We will continue IV fluids, encourage oral intake.  Avoid nephrotoxic agent and hypotension.  Patient on meloxicam at home, will likely need to be discontinued.  DM type II with hyperglycemia Likely related to infectious process, patient currently on sliding scale, at home she takes glipizide, metformin and Tradjenta.  Will start  Lantus 10 units nightly while inpatient, continue to monitor CBGs closely.  Check A1c.  Hypertension BP stable, holding lisinopril in view of AKI.  Continue hydralazine PRN and monitor BP closely.  DVT prophylaxis: Heparin sq Code Status: Full code Family Communication: Daughter at bedside Disposition Plan: Home in 1 to 2 days  Consultants:   None  Procedures:   None  Antimicrobials: Anti-infectives (From admission, onward)   Start     Dose/Rate Route Frequency Ordered Stop   02/05/18 2230  cefTRIAXone (ROCEPHIN) 1 g in sodium chloride 0.9 % 100 mL IVPB  Status:  Discontinued     1 g 200 mL/hr over 30 Minutes Intravenous  Once 02/05/18 2217 02/05/18 2228   02/05/18 2230  cefTRIAXone (ROCEPHIN) 1 g in sodium chloride 0.9 % 100 mL IVPB     1 g 200 mL/hr over 30 Minutes Intravenous Every 24 hours 02/05/18 2228         Objective: Vitals:   02/06/18 0757 02/06/18 0915 02/06/18 1343 02/06/18 1351  BP:  (!) 116/56 (!) 148/61 (!) 146/59  Pulse: 83 80 92 94  Resp:  18 (!) 28 (!) 22  Temp:  98.9 F (37.2 C) (!) 100.5 F (38.1 C) 99.4 F (37.4 C)  TempSrc:   Oral Oral  SpO2: 96% 97% 95% 96%    Intake/Output Summary (Last 24 hours) at 02/06/2018 1455 Last data filed at 02/06/2018 1328 Gross per 24 hour  Intake 1658.53 ml  Output -  Net 1658.53 ml   There were no vitals filed for this visit.  Examination:  General exam: Appears calm and comfortable  Respiratory system: Clear to auscultation. No wheezes,crackle or rhonchi Cardiovascular  system: S1 & S2 heard, RRR. No JVD, murmurs, rubs or gallops Gastrointestinal system: Abdomen is nondistended, soft and nontender. No organomegaly or masses felt.  Central nervous system: Alert and oriented.  Extremities: No pedal edema.  Skin: No rashes Psychiatry: Mood & affect appropriate.   Data Reviewed: I have personally reviewed following labs and imaging studies  CBC: Recent Labs  Lab 02/05/18 2056 02/06/18 0557  WBC  14.3* 14.6*  NEUTROABS 11.2*  --   HGB 10.4* 9.6*  HCT 31.9* 29.7*  MCV 84.4 86.6  PLT 297 284   Basic Metabolic Panel: Recent Labs  Lab 02/05/18 2056 02/06/18 0557  NA 135 138  K 4.2 4.2  CL 100 105  CO2 23 22  GLUCOSE 360* 273*  BUN 51* 47*  CREATININE 1.74* 1.57*  CALCIUM 8.7* 8.3*   GFR: CrCl cannot be calculated (Unknown ideal weight.). Liver Function Tests: Recent Labs  Lab 02/05/18 2056  AST 17  ALT 21  ALKPHOS 92  BILITOT 0.8  PROT 7.1  ALBUMIN 3.0*   Recent Labs  Lab 02/05/18 2056  LIPASE 38   No results for input(s): AMMONIA in the last 168 hours. Coagulation Profile: No results for input(s): INR, PROTIME in the last 168 hours. Cardiac Enzymes: Recent Labs  Lab 02/05/18 2056  TROPONINI <0.03   BNP (last 3 results) No results for input(s): PROBNP in the last 8760 hours. HbA1C: No results for input(s): HGBA1C in the last 72 hours. CBG: Recent Labs  Lab 02/05/18 2339 02/06/18 0742 02/06/18 1235  GLUCAP 310* 263* 323*   Lipid Profile: No results for input(s): CHOL, HDL, LDLCALC, TRIG, CHOLHDL, LDLDIRECT in the last 72 hours. Thyroid Function Tests: No results for input(s): TSH, T4TOTAL, FREET4, T3FREE, THYROIDAB in the last 72 hours. Anemia Panel: No results for input(s): VITAMINB12, FOLATE, FERRITIN, TIBC, IRON, RETICCTPCT in the last 72 hours. Sepsis Labs: Recent Labs  Lab 02/05/18 2052  LATICACIDVEN 0.92    Recent Results (from the past 240 hour(s))  Blood Culture (routine x 2)     Status: None (Preliminary result)   Collection Time: 02/05/18  8:45 PM  Result Value Ref Range Status   Specimen Description   Final    BLOOD BLOOD RIGHT FOREARM Performed at [facility], [address] [place], [place], [place] [address]    Special Requests   Final    BOTTLES DRAWN AEROBIC AND ANAEROBIC Blood Culture adequate volume Performed at [facility], [address], [place], [place] [address]    Culture    Final    NO GROWTH < 24 HOURS Performed at [facility], [address], [place], [place] [address]    Report Status PENDING  Incomplete  Blood Culture (routine x 2)     Status: None (Preliminary result)   Collection Time: 02/05/18  9:01 PM  Result Value Ref Range Status   Specimen Description   Final    BLOOD BLOOD LEFT FOREARM Performed at [facility], [address] [place], [place] [address]    Special Requests   Final    BOTTLES DRAWN AEROBIC AND ANAEROBIC Blood Culture adequate volume Performed at [facility], [address], [place], [place] [address]    Culture   Final    NO GROWTH < 24 HOURS Performed at [facility], [address] [place], [place] [address]    Report Status PENDING  Incomplete      Radiology Studies: Dg Chest  2 View  Result Date: 02/05/2018 CLINICAL DATA:  Fever and cough with lethargy since Friday. EXAM: CHEST - 2 VIEW COMPARISON:  None. FINDINGS: Low lung volumes with bibasilar subsegmental atelectasis. Heart is normal in size. Mild aortic atherosclerosis is noted at the arch without aneurysm. No acute osseous abnormality is seen. IMPRESSION: Low lung volumes with streaky subsegmental atelectasis noted at each base. Electronically Signed   By: Tollie Eth M.D.   On: 02/05/2018 21:58    Scheduled Meds: . ezetimibe  10 mg Oral Daily  . feeding supplement (ENSURE ENLIVE)  237 mL Oral BID BM  . heparin  5,000 Units Subcutaneous Q8H  . insulin aspart  0-15 Units Subcutaneous TID WC  . levothyroxine  88 mcg Oral Daily  . multivitamin with minerals  1 tablet Oral Daily  . simvastatin  40 mg Oral QHS   Continuous Infusions: . sodium chloride 75 mL/hr at 02/06/18 1306  . cefTRIAXone (ROCEPHIN)  IV Stopped (02/05/18 2312)     LOS: 1 day   Initial encounter: 2:55 PM - Time spent: Total of 35 minutes spent with pt, greater than 50% of which was spent in discussion of  treatment, counseling and  coordination of care  Prolonged services 40 minutes. Time of encounter from 4:35 PM to 5:15 PM   Latrelle Dodrill, MD Pager: Text Page via [email or web address]   If 7PM-7AM, please contact night-coverage [email or web address] 02/06/2018, 2:55 PM   Note - This record has been created using [employer]. Chart creation errors have been sought, but may not always have been located. Such creation errors do not reflect on the standard of medical care.

## 2018-02-06 NOTE — Progress Notes (Signed)
Inpatient Diabetes Program Recommendations  AACE/ADA: New Consensus Statement on Inpatient Glycemic Control (2015)  Target Ranges:  Prepandial:   less than 140 mg/dL      Peak postprandial:   less than 180 mg/dL (1-2 hours)      Critically ill patients:  140 - 180 mg/dL   Results for [name], Patty Jones (MRN [id number]) as of 02/06/2018 13:13  Ref. Range 02/05/2018 23:39 02/06/2018 07:42 02/06/2018 12:35  Glucose-Capillary Latest Ref Range: 70 - 99 mg/dL 409 (H)  5 units NOVOLOG  263 (H)  8 units NOVOLOG  323 (H)    Admit Sepsis secondary to UTI  History: DM  Home DM Meds: Tradjenta 5 mg Daily        Metformin 500 mg BID        Glipizide 10 mg Daily  Current Orders: Novolog Moderate Correction Scale/ SSI (0-15 units) TID AC       CBGs severely elevated since admission.  Oral DM Meds (Glipizide, Tradjenta, and Metformin) on hold at present.     MD- Please consider the following in-hospital insulin adjustments while patient's home meds are on hold:  1. Start Lantus 10 units QHS (0.15 units/kg based on weight of 72 kg from Office Visit notes from 01/31/2018 [facility])  2. Increase Novolog SSI to Resistant Scale (0-20 units) TID AC + HS  3. Check Current Hemoglobin A1c level-- None on file     --Will follow patient during hospitalization--  Ambrose Finland RN, MSN, CDE Diabetes Coordinator Inpatient Glycemic Control Team Team Pager: [phone] (8a-5p)

## 2018-02-07 ENCOUNTER — Encounter (HOSPITAL_COMMUNITY): Payer: Self-pay

## 2018-02-07 LAB — BLOOD CULTURE ID PANEL (REFLEXED)
ACINETOBACTER BAUMANNII: NOT DETECTED
CANDIDA ALBICANS: NOT DETECTED
CANDIDA PARAPSILOSIS: NOT DETECTED
Candida glabrata: NOT DETECTED
Candida krusei: NOT DETECTED
Candida tropicalis: NOT DETECTED
Carbapenem resistance: NOT DETECTED
ENTEROBACTER CLOACAE COMPLEX: NOT DETECTED
ENTEROBACTERIACEAE SPECIES: DETECTED — AB
Enterococcus species: NOT DETECTED
Escherichia coli: NOT DETECTED
HAEMOPHILUS INFLUENZAE: NOT DETECTED
KLEBSIELLA OXYTOCA: NOT DETECTED
KLEBSIELLA PNEUMONIAE: DETECTED — AB
Listeria monocytogenes: NOT DETECTED
Neisseria meningitidis: NOT DETECTED
PSEUDOMONAS AERUGINOSA: NOT DETECTED
Proteus species: NOT DETECTED
STREPTOCOCCUS PNEUMONIAE: NOT DETECTED
STREPTOCOCCUS PYOGENES: NOT DETECTED
STREPTOCOCCUS SPECIES: NOT DETECTED
Serratia marcescens: NOT DETECTED
Staphylococcus aureus (BCID): NOT DETECTED
Staphylococcus species: NOT DETECTED
Streptococcus agalactiae: NOT DETECTED

## 2018-02-07 LAB — CBC WITH DIFFERENTIAL/PLATELET
ABS IMMATURE GRANULOCYTES: 0.38 10*3/uL — AB (ref 0.00–0.07)
BASOS ABS: 0 10*3/uL (ref 0.0–0.1)
Basophils Relative: 0 %
Eosinophils Absolute: 0.1 10*3/uL (ref 0.0–0.5)
Eosinophils Relative: 1 %
HEMATOCRIT: 29.2 % — AB (ref 36.0–46.0)
Hemoglobin: 9.3 g/dL — ABNORMAL LOW (ref 12.0–15.0)
IMMATURE GRANULOCYTES: 3 %
LYMPHS ABS: 2 10*3/uL (ref 0.7–4.0)
LYMPHS PCT: 13 %
MCH: 27.4 pg (ref 26.0–34.0)
MCHC: 31.8 g/dL (ref 30.0–36.0)
MCV: 85.9 fL (ref 80.0–100.0)
Monocytes Absolute: 1.5 10*3/uL — ABNORMAL HIGH (ref 0.1–1.0)
Monocytes Relative: 10 %
NEUTROS ABS: 11 10*3/uL — AB (ref 1.7–7.7)
NEUTROS PCT: 73 %
NRBC: 0 % (ref 0.0–0.2)
Platelets: 313 10*3/uL (ref 150–400)
RBC: 3.4 MIL/uL — ABNORMAL LOW (ref 3.87–5.11)
RDW: 14.3 % (ref 11.5–15.5)
WBC: 14.9 10*3/uL — ABNORMAL HIGH (ref 4.0–10.5)

## 2018-02-07 LAB — BASIC METABOLIC PANEL
ANION GAP: 9 (ref 5–15)
BUN: 34 mg/dL — ABNORMAL HIGH (ref 8–23)
CALCIUM: 8.2 mg/dL — AB (ref 8.9–10.3)
CHLORIDE: 104 mmol/L (ref 98–111)
CO2: 22 mmol/L (ref 22–32)
Creatinine, Ser: 1.37 mg/dL — ABNORMAL HIGH (ref 0.44–1.00)
GFR calc non Af Amer: 37 mL/min — ABNORMAL LOW (ref >60)
GFR, EST AFRICAN AMERICAN: 43 mL/min — AB (ref >60)
Glucose, Bld: 280 mg/dL — ABNORMAL HIGH (ref 70–99)
POTASSIUM: 4.2 mmol/L (ref 3.5–5.1)
Sodium: 135 mmol/L (ref 135–145)

## 2018-02-07 LAB — HEMOGLOBIN A1C
Hgb A1c MFr Bld: 9.4 % — ABNORMAL HIGH (ref 4.8–5.6)
Mean Plasma Glucose: 223.08 mg/dL

## 2018-02-07 LAB — GLUCOSE, CAPILLARY
Glucose-Capillary: 277 mg/dL — ABNORMAL HIGH (ref 70–99)
Glucose-Capillary: 338 mg/dL — ABNORMAL HIGH (ref 70–99)
Glucose-Capillary: 283 mg/dL — ABNORMAL HIGH (ref 70–99)
Glucose-Capillary: 318 mg/dL — ABNORMAL HIGH (ref 70–99)

## 2018-02-07 LAB — MAGNESIUM: Magnesium: 2 mg/dL (ref 1.7–2.4)

## 2018-02-07 MED ORDER — POLYETHYLENE GLYCOL 3350 17 G PO PACK
17.0000 g | PACK | Freq: Every day | ORAL | Status: DC | PRN
  Administered 2018-02-07: 17 g via ORAL
  Filled 2018-02-07: qty 1

## 2018-02-07 MED ORDER — LIP MEDEX EX OINT
TOPICAL_OINTMENT | CUTANEOUS | Status: DC | PRN
  Administered 2018-02-07: 09:00:00 via TOPICAL
  Filled 2018-02-07: qty 7

## 2018-02-07 NOTE — Progress Notes (Signed)
PHARMACY - PHYSICIAN COMMUNICATION CRITICAL VALUE ALERT - BLOOD CULTURE IDENTIFICATION (BCID)  Patty Jones is an 72 y.o. female who presented to [facility] on 02/05/2018 with a chief complaint of UTI  Assessment:  UTI (include suspected source if known)  Name of physician (or Provider) Contacted: Merdis Delay, NP  Current antibiotics: Meropenem  Changes to prescribed antibiotics recommended:  Coverage was broaden 10/17 d/t persistent fever. Consider narrowing back to Rocephin 2 gm IV q24h when clinically appropriate.   Results for orders placed or performed during the hospital encounter of 02/05/18  Blood Culture ID Panel (Reflexed) (Collected: 02/05/2018  8:45 PM)  Result Value Ref Range   Enterococcus species NOT DETECTED NOT DETECTED   Listeria monocytogenes NOT DETECTED NOT DETECTED   Staphylococcus species NOT DETECTED NOT DETECTED   Staphylococcus aureus (BCID) NOT DETECTED NOT DETECTED   Streptococcus species NOT DETECTED NOT DETECTED   Streptococcus agalactiae NOT DETECTED NOT DETECTED   Streptococcus pneumoniae NOT DETECTED NOT DETECTED   Streptococcus pyogenes NOT DETECTED NOT DETECTED   Acinetobacter baumannii NOT DETECTED NOT DETECTED   Enterobacteriaceae species DETECTED (A) NOT DETECTED   Enterobacter cloacae complex NOT DETECTED NOT DETECTED   Escherichia coli NOT DETECTED NOT DETECTED   Klebsiella oxytoca NOT DETECTED NOT DETECTED   Klebsiella pneumoniae DETECTED (A) NOT DETECTED   Proteus species NOT DETECTED NOT DETECTED   Serratia marcescens NOT DETECTED NOT DETECTED   Carbapenem resistance NOT DETECTED NOT DETECTED   Haemophilus influenzae NOT DETECTED NOT DETECTED   Neisseria meningitidis NOT DETECTED NOT DETECTED   Pseudomonas aeruginosa NOT DETECTED NOT DETECTED   Candida albicans NOT DETECTED NOT DETECTED   Candida glabrata NOT DETECTED NOT DETECTED   Candida krusei NOT DETECTED NOT DETECTED   Candida parapsilosis NOT DETECTED NOT DETECTED   Candida  tropicalis NOT DETECTED NOT DETECTED    Lorenza Evangelist 02/07/2018  3:28 AM

## 2018-02-07 NOTE — Progress Notes (Addendum)
PROGRESS NOTE [facility]   Patty Jones   ZOX:[id number] DOB: [birth year]  DOA: 02/05/2018 PCP: System, Pcp Not In   Brief Narrative:  Patty Jones is a 72 year old female with medical history significant of diabetes mellitus type 2 and hypertension, presented to the emergency department complaining of fever, fatigue and confusion.  Apparently patient have decreased p.o. intake since Friday with intermittent fevers and elevated blood glucose.  Upon ED evaluation chest x-ray was negative, UA was grossly abnormal and lab work-up reveals creatinine of 1.7 BUN 51 WBC 14.3.  Patient was afebrile T-max 101.5.  Patient was admitted with working diagnosis of sepsis secondary to UTI.  Subjective: Patient seen and examined, afebrile since last night. No nausea or vomiting. Mild abdominal pain   Assessment & Plan: Sepsis secondary to UTI/Pyelonephritis    Sepsis physiology has improving, UA grossly abnormal, urine culture shows enterobacter aerogenes. patient currently on Meropenem. Will continue until sensitivities are resulted. Trend fever and WBC   Klebsiella bacteremia, likely from pneumonia  CT abd/pelvis and CXR show signs of pneumonia, patient on abx as above. Repeat blood cultures. WBC remains elevated. Continue IV abx and de-escalate pending blood cultures sensitivities.   AKI Likely secondary to poor oral intake and infectious process.  Creatinine improving with IV hydration. Continue gentle hydration, encourage oral intake.  Avoid nephrotoxic agent and hypotension.  Patient on meloxicam at home, will likely need to be discontinued.  DM type II with hyperglycemia Likely related to infectious process, patient currently on sliding scale, at home she takes glipizide, metformin and Tradjenta.  Continue Lantus 10 units nightly and SSI while inpatient, continue to monitor CBGs closely.  A1c 9.4   Hypertension BP slight above goal due to holding lisinopril in view of AKI.  Continue  hydralazine PRN and monitor BP closely.  DVT prophylaxis: Heparin sq Code Status: Full code Family Communication: Daughter at bedside Disposition Plan: Home in 24-48 hrs if clinically improving and sensitivities are resulted   Consultants:   None  Procedures:   None  Antimicrobials: Anti-infectives (From admission, onward)   Start     Dose/Rate Route Frequency Ordered Stop   02/06/18 1800  meropenem (MERREM) 1 g in sodium chloride 0.9 % 100 mL IVPB     1 g 200 mL/hr over 30 Minutes Intravenous Every 12 hours 02/06/18 1724     02/05/18 2230  cefTRIAXone (ROCEPHIN) 1 g in sodium chloride 0.9 % 100 mL IVPB  Status:  Discontinued     1 g 200 mL/hr over 30 Minutes Intravenous  Once 02/05/18 2217 02/05/18 2228   02/05/18 2230  cefTRIAXone (ROCEPHIN) 1 g in sodium chloride 0.9 % 100 mL IVPB  Status:  Discontinued     1 g 200 mL/hr over 30 Minutes Intravenous Every 24 hours 02/05/18 2228 02/06/18 1713      Objective: Vitals:   02/06/18 1914 02/06/18 2011 02/07/18 0501 02/07/18 1419  BP: (!) 124/54 125/71 (!) 143/70 (!) 141/71  Pulse: 87 84 85 86  Resp:  20 20 20   Temp: 99.4 F (37.4 C) 98.4 F (36.9 C) 99 F (37.2 C) 100.3 F (37.9 C)  TempSrc: Oral Oral  Oral  SpO2: 96% 95% 96% 96%    Intake/Output Summary (Last 24 hours) at 02/07/2018 1528 Last data filed at 02/07/2018 1325 Gross per 24 hour  Intake 1075.73 ml  Output 900 ml  Net 175.73 ml   There were no vitals filed for this visit.  Examination:  General: Pt is alert, awake,  not in acute distress Cardiovascular: RRR, S1/S2 +, no rubs, no gallops Respiratory: Decrease breath sounds b/l more prominent on LLB  Abdominal: Soft, NT, ND, bowel sounds + Extremities: no edema  Data Reviewed: I have personally reviewed following labs and imaging studies  CBC: Recent Labs  Lab 02/05/18 2056 02/06/18 0557 02/07/18 0544  WBC 14.3* 14.6* 14.9*  NEUTROABS 11.2*  --  11.0*  HGB 10.4* 9.6* 9.3*  HCT 31.9* 29.7*  29.2*  MCV 84.4 86.6 85.9  PLT 297 284 313   Basic Metabolic Panel: Recent Labs  Lab 02/05/18 2056 02/06/18 0557 02/07/18 0544  NA 135 138 135  K 4.2 4.2 4.2  CL 100 105 104  CO2 23 22 22   GLUCOSE 360* 273* 280*  BUN 51* 47* 34*  CREATININE 1.74* 1.57* 1.37*  CALCIUM 8.7* 8.3* 8.2*  MG  --   --  2.0   GFR: CrCl cannot be calculated (Unknown ideal weight.). Liver Function Tests: Recent Labs  Lab 02/05/18 2056  AST 17  ALT 21  ALKPHOS 92  BILITOT 0.8  PROT 7.1  ALBUMIN 3.0*   Recent Labs  Lab 02/05/18 2056  LIPASE 38   No results for input(s): AMMONIA in the last 168 hours. Coagulation Profile: No results for input(s): INR, PROTIME in the last 168 hours. Cardiac Enzymes: Recent Labs  Lab 02/05/18 2056  TROPONINI <0.03   BNP (last 3 results) No results for input(s): PROBNP in the last 8760 hours. HbA1C: Recent Labs    02/07/18 0544  HGBA1C 9.4*   CBG: Recent Labs  Lab 02/06/18 1235 02/06/18 1611 02/06/18 2108 02/07/18 0743 02/07/18 1144  GLUCAP 323* 302* 186* 277* 338*   Lipid Profile: No results for input(s): CHOL, HDL, LDLCALC, TRIG, CHOLHDL, LDLDIRECT in the last 72 hours. Thyroid Function Tests: No results for input(s): TSH, T4TOTAL, FREET4, T3FREE, THYROIDAB in the last 72 hours. Anemia Panel: No results for input(s): VITAMINB12, FOLATE, FERRITIN, TIBC, IRON, RETICCTPCT in the last 72 hours. Sepsis Labs: Recent Labs  Lab 02/05/18 2052  LATICACIDVEN 0.92    Recent Results (from the past 240 hour(s))  Blood Culture (routine x 2)     Status: None (Preliminary result)   Collection Time: 02/05/18  8:45 PM  Result Value Ref Range Status   Specimen Description   Final    BLOOD BLOOD RIGHT FOREARM Performed at [facility], [address], [place], [place] [address]    Special Requests   Final    BOTTLES DRAWN AEROBIC AND ANAEROBIC Blood Culture adequate volume Performed at [facility], [address], [place], [place] [address]    Culture  Setup Time   Final    GRAM NEGATIVE RODS AEROBIC BOTTLE ONLY Organism ID to follow CRITICAL RESULT CALLED TO, READ BACK BY AND VERIFIED WITH: B GREEN PHARMD [id number] 02/07/18 A BROWNING Performed at [facility], [address] [place], [place] [address]    Culture GRAM NEGATIVE RODS  Final   Report Status PENDING  Incomplete  Blood Culture ID Panel (Reflexed)     Status: Abnormal   Collection Time: 02/05/18  8:45 PM  Result Value Ref Range Status   Enterococcus species NOT DETECTED NOT DETECTED Final   Listeria monocytogenes NOT DETECTED NOT DETECTED Final   Staphylococcus species NOT DETECTED NOT DETECTED Final   Staphylococcus aureus (BCID) NOT DETECTED NOT DETECTED Final   Streptococcus species NOT DETECTED NOT DETECTED Final   Streptococcus agalactiae NOT DETECTED NOT DETECTED  Final   Streptococcus pneumoniae NOT DETECTED NOT DETECTED Final   Streptococcus pyogenes NOT DETECTED NOT DETECTED Final   Acinetobacter baumannii NOT DETECTED NOT DETECTED Final   Enterobacteriaceae species DETECTED (A) NOT DETECTED Final    Comment: Enterobacteriaceae represent a large family of gram-negative bacteria, not a single organism. CRITICAL RESULT CALLED TO, READ BACK BY AND VERIFIED WITH: B GREEN PHARMD 0322 02/07/18 A BROWNING    Enterobacter cloacae complex NOT DETECTED NOT DETECTED Final   Escherichia coli NOT DETECTED NOT DETECTED Final   Klebsiella oxytoca NOT DETECTED NOT DETECTED Final   Klebsiella pneumoniae DETECTED (A) NOT DETECTED Final    Comment: CRITICAL RESULT CALLED TO, READ BACK BY AND VERIFIED WITH: B GREEN PHARMD 0322 02/07/18 A BROWNING    Proteus species NOT DETECTED NOT DETECTED Final   Serratia marcescens NOT DETECTED NOT DETECTED Final   Carbapenem resistance NOT DETECTED NOT DETECTED Final   Haemophilus influenzae NOT DETECTED NOT DETECTED Final   Neisseria meningitidis NOT DETECTED NOT DETECTED Final    Pseudomonas aeruginosa NOT DETECTED NOT DETECTED Final   Candida albicans NOT DETECTED NOT DETECTED Final   Candida glabrata NOT DETECTED NOT DETECTED Final   Candida krusei NOT DETECTED NOT DETECTED Final   Candida parapsilosis NOT DETECTED NOT DETECTED Final   Candida tropicalis NOT DETECTED NOT DETECTED Final    Comment: Performed at [facility], [address], [place], [place] [address]  Blood Culture (routine x 2)     Status: None (Preliminary result)   Collection Time: 02/05/18  9:01 PM  Result Value Ref Range Status   Specimen Description   Final    BLOOD BLOOD LEFT FOREARM Performed at [facility], [address] [place], [place] [address]    Special Requests   Final    BOTTLES DRAWN AEROBIC AND ANAEROBIC Blood Culture adequate volume Performed at [facility], [address] [place], [place] [address]    Culture   Final    NO GROWTH 2 DAYS Performed at [facility], [address], [place], [place] [address]    Report Status PENDING  Incomplete  Urine culture     Status: Abnormal (Preliminary result)   Collection Time: 02/05/18  9:30 PM  Result Value Ref Range Status   Specimen Description   Final    URINE, CLEAN CATCH Performed at [facility], [address] [place], [place] [address]    Special Requests   Final    Normal Performed at [facility], [address], [place], [place] [address]    Culture >=100,000 COLONIES/mL ENTEROBACTER AEROGENES (A)  Final   Report Status PENDING  Incomplete      Radiology Studies: Ct Abdomen Pelvis Wo Contrast  Result Date: 02/06/2018 CLINICAL DATA:  UTI with high grade fever. EXAM: CT ABDOMEN AND PELVIS WITHOUT CONTRAST TECHNIQUE: Multidetector CT imaging of the abdomen and pelvis was performed following the standard protocol without IV contrast. COMPARISON:  None. FINDINGS: Lower chest: Bilateral lower lobe atelectasis versus airspace  consolidation. Hepatobiliary: No focal liver abnormality is seen. Status post cholecystectomy. No biliary dilatation. Pancreas: Unremarkable. No pancreatic ductal dilatation or surrounding inflammatory changes. Spleen: Normal in size without focal abnormality. Adrenals/Urinary Tract: Normal appearance of the adrenal glands. Mild right perinephric stranding. Moderate left perinephric fat stranding. There is a large nonobstructive calculus in the upper pole of the left kidney which measures 2.1 cm. No evidence of hydronephrosis or hydroureter. Stomach/Bowel: Stomach  is within normal limits. Appendix appears normal. No evidence of bowel wall thickening, distention, or inflammatory changes. Vascular/Lymphatic: Aortic atherosclerosis. No enlarged abdominal or pelvic lymph nodes. Reproductive: Leiomyomatous uterus. Other: Small wide neck fat and bowel containing periumbilical anterior abdominal wall hernia. No evidence of strangulation. No evidence of abdominal ascites. Musculoskeletal: Lower lumbosacral spine fusion with intact hardware. IMPRESSION: Bilateral inflammatory perinephric fat stranding, much more prominent on the left. Large nonobstructive 2.1 cm calculus in the upper pole of the left kidney. No evidence of hydronephrosis or hydroureter. Small wide neck fat and bowel containing periumbilical anterior abdominal wall hernia. No evidence of strangulation. Bilateral lower lobe pulmonary atelectasis versus airspace consolidation. Leiomyomatous uterus. Intact lower lumbosacral spine fusion. Electronically Signed   By: Ted Mcalpine M.D.   On: 02/06/2018 19:01   Dg Chest 2 View  Result Date: 02/05/2018 CLINICAL DATA:  Fever and cough with lethargy since Friday. EXAM: CHEST - 2 VIEW COMPARISON:  None. FINDINGS: Low lung volumes with bibasilar subsegmental atelectasis. Heart is normal in size. Mild aortic atherosclerosis is noted at the arch without aneurysm. No acute osseous abnormality is seen.  IMPRESSION: Low lung volumes with streaky subsegmental atelectasis noted at each base. Electronically Signed   By: Tollie Eth M.D.   On: 02/05/2018 21:58   Dg Chest Port 1 View  Result Date: 02/06/2018 CLINICAL DATA:  Fever, fatigue and confusion. EXAM: PORTABLE CHEST 1 VIEW COMPARISON:  02/05/2018 FINDINGS: Heart size is normal. Aortic atherosclerosis as seen previously. Abnormal density in both lower lobes consistent with pneumonia. There could be an element of chronic scarring coexistent. The upper lungs are clear. No visible effusions. IMPRESSION: No change since yesterday. Abnormal markings at both lung bases consistent with pneumonia. Without older comparison films, one could not exclude that some of this density could be chronic scarring, but pneumonia is favored. Electronically Signed   By: Paulina Fusi M.D.   On: 02/06/2018 17:45    Scheduled Meds: . ezetimibe  10 mg Oral Daily  . feeding supplement (ENSURE ENLIVE)  237 mL Oral BID BM  . heparin  5,000 Units Subcutaneous Q8H  . insulin aspart  0-20 Units Subcutaneous TID WC  . insulin glargine  10 Units Subcutaneous QHS  . levothyroxine  88 mcg Oral Daily  . multivitamin with minerals  1 tablet Oral Daily  . simvastatin  40 mg Oral QHS   Continuous Infusions: . sodium chloride 75 mL/hr at 02/07/18 0401  . meropenem (MERREM) IV 1 g (02/07/18 0749)     LOS: 2 days   Time spent: Total of 35 minutes spent with pt, greater than 50% of which was spent in discussion of  treatment, counseling and coordination of care    Latrelle Dodrill, MD Pager: Text Page via [email or web address]   If 7PM-7AM, please contact night-coverage [email or web address] 02/07/2018, 3:28 PM   Note - This record has been created using [employer]. Chart creation errors have been sought, but may not always have been located. Such creation errors do not reflect on the standard of medical care.

## 2018-02-07 NOTE — Progress Notes (Addendum)
Inpatient Diabetes Program Recommendations  AACE/ADA: New Consensus Statement on Inpatient Glycemic Control (2015)  Target Ranges:  Prepandial:   less than 140 mg/dL      Peak postprandial:   less than 180 mg/dL (1-2 hours)      Critically ill patients:  140 - 180 mg/dL   Lab Results  Component Value Date   GLUCAP 277 (H) 02/07/2018   HGBA1C 9.4 (H) 02/07/2018    Review of Glycemic ControlResults for Patty Jones, Patty Jones (MRN [id number]) as of 02/07/2018 11:04  Ref. Range 02/06/2018 07:42 02/06/2018 12:35 02/06/2018 16:11 02/06/2018 21:08 02/07/2018 07:43  Glucose-Capillary Latest Ref Range: 70 - 99 mg/dL 409 (H) 811 (H) 914 (H) 186 (H) 277 (H)   History: DM  Home DM Meds: Tradjenta 5 mg Daily                              Metformin 500 mg BID                              Glipizide 10 mg Daily  Current Orders: Novolog Moderate Correction Scale/ SSI (0-15 units) TID AC, Lantus 10 units q HS Inpatient Diabetes Program Recommendations:   Note elevated A1C.  May need basal insulin at d/c? Consider increasing Lantus to 15 units q HS. May also consider adding Novolog meal coverage 3 units tid with meals (hold if patient eats less than 50%).   Thanks,  Beryl Meager, RN, BC-ADM Inpatient Diabetes Coordinator Pager [phone] (8a-5p)  1500 addendum:  Spoke with patient regarding elevated A1C.  She states that her blood sugars were doing well until she got sick.  She states that prior to getting sick her blood sugars were in the 150's.  We discussed current A1C.  Patient was surprised by how high it was.  Note that oral agents can be increased at d/c if needed?  Unclear if she will need insulin at home or not?  Will continue to follow.  We discussed the importance of glycemic control and the importance of follow-up with PCP after hospitalization.

## 2018-02-08 LAB — CBC WITH DIFFERENTIAL/PLATELET
Abs Immature Granulocytes: 0.26 10*3/uL — ABNORMAL HIGH (ref 0.00–0.07)
BASOS PCT: 0 %
Basophils Absolute: 0 10*3/uL (ref 0.0–0.1)
EOS ABS: 0.2 10*3/uL (ref 0.0–0.5)
Eosinophils Relative: 2 %
HEMATOCRIT: 29.6 % — AB (ref 36.0–46.0)
Hemoglobin: 9.5 g/dL — ABNORMAL LOW (ref 12.0–15.0)
Immature Granulocytes: 2 %
Lymphocytes Relative: 16 %
Lymphs Abs: 2.1 10*3/uL (ref 0.7–4.0)
MCH: 27.5 pg (ref 26.0–34.0)
MCHC: 32.1 g/dL (ref 30.0–36.0)
MCV: 85.5 fL (ref 80.0–100.0)
MONO ABS: 1.2 10*3/uL — AB (ref 0.1–1.0)
MONOS PCT: 9 %
NEUTROS PCT: 71 %
Neutro Abs: 9.4 10*3/uL — ABNORMAL HIGH (ref 1.7–7.7)
PLATELETS: 397 10*3/uL (ref 150–400)
RBC: 3.46 MIL/uL — AB (ref 3.87–5.11)
RDW: 14.3 % (ref 11.5–15.5)
WBC: 13.2 10*3/uL — ABNORMAL HIGH (ref 4.0–10.5)
nRBC: 0.2 % (ref 0.0–0.2)

## 2018-02-08 LAB — GLUCOSE, CAPILLARY
GLUCOSE-CAPILLARY: 240 mg/dL — AB (ref 70–99)
Glucose-Capillary: 244 mg/dL — ABNORMAL HIGH (ref 70–99)
Glucose-Capillary: 244 mg/dL — ABNORMAL HIGH (ref 70–99)
Glucose-Capillary: 309 mg/dL — ABNORMAL HIGH (ref 70–99)

## 2018-02-08 LAB — BASIC METABOLIC PANEL
ANION GAP: 8 (ref 5–15)
BUN: 23 mg/dL (ref 8–23)
CALCIUM: 8.2 mg/dL — AB (ref 8.9–10.3)
CO2: 24 mmol/L (ref 22–32)
Chloride: 104 mmol/L (ref 98–111)
Creatinine, Ser: 1.19 mg/dL — ABNORMAL HIGH (ref 0.44–1.00)
GFR calc Af Amer: 51 mL/min — ABNORMAL LOW (ref >60)
GFR, EST NON AFRICAN AMERICAN: 44 mL/min — AB (ref >60)
GLUCOSE: 264 mg/dL — AB (ref 70–99)
POTASSIUM: 4.2 mmol/L (ref 3.5–5.1)
Sodium: 136 mmol/L (ref 135–145)

## 2018-02-08 MED ORDER — LISINOPRIL 20 MG PO TABS
20.0000 mg | ORAL_TABLET | Freq: Every day | ORAL | Status: DC
  Administered 2018-02-08 – 2018-02-09 (×2): 20 mg via ORAL
  Filled 2018-02-08 (×2): qty 1

## 2018-02-08 MED ORDER — DIPHENHYDRAMINE HCL 25 MG PO CAPS
25.0000 mg | ORAL_CAPSULE | Freq: Once | ORAL | Status: AC
  Administered 2018-02-08: 25 mg via ORAL
  Filled 2018-02-08: qty 1

## 2018-02-08 MED ORDER — GLIPIZIDE ER 5 MG PO TB24
10.0000 mg | ORAL_TABLET | Freq: Every day | ORAL | Status: DC
  Administered 2018-02-08 – 2018-02-09 (×2): 10 mg via ORAL
  Filled 2018-02-08 (×2): qty 2

## 2018-02-08 NOTE — Progress Notes (Signed)
PROGRESS NOTE [facility]   Patty Jones   ZOX:[id number] DOB: 04/29/1945  DOA: 02/05/2018 PCP: System, Pcp Not In   Brief Narrative:  Patty Jones is a 72 year old female with medical history significant of diabetes mellitus type 2 and hypertension, presented to the emergency department complaining of fever, fatigue and confusion.  Apparently patient have decreased p.o. intake since Friday with intermittent fevers and elevated blood glucose.  Upon ED evaluation chest x-ray was negative, UA was grossly abnormal and lab work-up reveals creatinine of 1.7 BUN 51 WBC 14.3.  Patient was afebrile T-max 101.5.  Patient was admitted with working diagnosis of sepsis secondary to UTI.  Subjective: No complaints, she is up in chair.  Remains afebrile.  Denies nausea and vomiting.  Abdominal pain improved.  Assessment & Plan: Sepsis secondary to UTI/Pyelonephritis    Sepsis physiology improved. UA grossly abnormal, urine culture shows enterobacter aerogenes.  On meropenem, sensitivities showed only resistant to cefazolin.  Will continue Merrem for now as we are awaiting blood culture sensitivities.  Continue to monitor WBC and fever.  Klebsiella bacteremia, likely from pneumonia  CT abd/pelvis and CXR show signs of pneumonia, blood cultures with Klebsiella pneumoniae, sensitivities pending, WBC improving, remains afebrile.  Continue Merrem until sensitivities are back.  Repeated blood cultures in process.  AKI Secondary to poor oral intake and infectious process.  Creatinine continues to improve, encourage oral hydration.  Will continue gentle IV fluid for now. Avoid nephrotoxic agent and hypotension.  Patient on meloxicam at home, will likely need to be discontinued.  DM type II with hyperglycemia Likely related to infectious process, patient currently on sliding scale, at home she takes glipizide, metformin and Tradjenta.  While inpatient placement on Lantus 10 units nightly and insulin  sliding scale.  We will resume glipizide.  A1c 9.4.  At discharge will adjust oral hypoglycemic medications, no need for insulin at this time.  Hypertension BP continues to be slight above goal, will resume lisinopril since renal function has significantly improved.  Continue to monitor BP closely  DVT prophylaxis: Heparin sq Code Status: Full code Family Communication: Daughter at bedside Disposition Plan: Home when sensitivities are resulted and able to de-escalate antibiotic therapy.  Consultants:   None  Procedures:   None  Antimicrobials: Anti-infectives (From admission, onward)   Start     Dose/Rate Route Frequency Ordered Stop   02/06/18 1800  meropenem (MERREM) 1 g in sodium chloride 0.9 % 100 mL IVPB     1 g 200 mL/hr over 30 Minutes Intravenous Every 12 hours 02/06/18 1724     02/05/18 2230  cefTRIAXone (ROCEPHIN) 1 g in sodium chloride 0.9 % 100 mL IVPB  Status:  Discontinued     1 g 200 mL/hr over 30 Minutes Intravenous  Once 02/05/18 2217 02/05/18 2228   02/05/18 2230  cefTRIAXone (ROCEPHIN) 1 g in sodium chloride 0.9 % 100 mL IVPB  Status:  Discontinued     1 g 200 mL/hr over 30 Minutes Intravenous Every 24 hours 02/05/18 2228 02/06/18 1713      Objective: Vitals:   02/07/18 2032 02/08/18 0409 02/08/18 0804 02/08/18 1346  BP: (!) 147/71 (!) 155/73  (!) 146/77  Pulse:  83  84  Resp:  14  18  Temp: 99.4 F (37.4 C) 99 F (37.2 C)  98 F (36.7 C)  TempSrc: Oral Oral  Oral  SpO2:  94% 95% 100%    Intake/Output Summary (Last 24 hours) at 02/08/2018 1406 Last data filed at  02/08/2018 1000 Gross per 24 hour  Intake 2495.36 ml  Output 1201 ml  Net 1294.36 ml   There were no vitals filed for this visit.  Examination:  General: Pt is alert, awake, not in acute distress Cardiovascular: RRR, S1/S2 +, no rubs, no gallops Respiratory: Air entry on left lower lobe improving, no crackles or wheezing. Abdominal: Soft, NT, ND, bowel sounds + Extremities: no  edema  Data Reviewed: I have personally reviewed following labs and imaging studies  CBC: Recent Labs  Lab 02/05/18 2056 02/06/18 0557 02/07/18 0544 02/08/18 0554  WBC 14.3* 14.6* 14.9* 13.2*  NEUTROABS 11.2*  --  11.0* 9.4*  HGB 10.4* 9.6* 9.3* 9.5*  HCT 31.9* 29.7* 29.2* 29.6*  MCV 84.4 86.6 85.9 85.5  PLT 297 284 313 397   Basic Metabolic Panel: Recent Labs  Lab 02/05/18 2056 02/06/18 0557 02/07/18 0544 02/08/18 0554  NA 135 138 135 136  K 4.2 4.2 4.2 4.2  CL 100 105 104 104  CO2 23 22 22 24   GLUCOSE 360* 273* 280* 264*  BUN 51* 47* 34* 23  CREATININE 1.74* 1.57* 1.37* 1.19*  CALCIUM 8.7* 8.3* 8.2* 8.2*  MG  --   --  2.0  --    GFR: CrCl cannot be calculated (Unknown ideal weight.). Liver Function Tests: Recent Labs  Lab 02/05/18 2056  AST 17  ALT 21  ALKPHOS 92  BILITOT 0.8  PROT 7.1  ALBUMIN 3.0*   Recent Labs  Lab 02/05/18 2056  LIPASE 38   No results for input(s): AMMONIA in the last 168 hours. Coagulation Profile: No results for input(s): INR, PROTIME in the last 168 hours. Cardiac Enzymes: Recent Labs  Lab 02/05/18 2056  TROPONINI <0.03   BNP (last 3 results) No results for input(s): PROBNP in the last 8760 hours. HbA1C: Recent Labs    02/07/18 0544  HGBA1C 9.4*   CBG: Recent Labs  Lab 02/07/18 1144 02/07/18 1646 02/07/18 2031 02/08/18 0750 02/08/18 1136  GLUCAP 338* 283* 318* 244* 244*   Lipid Profile: No results for input(s): CHOL, HDL, LDLCALC, TRIG, CHOLHDL, LDLDIRECT in the last 72 hours. Thyroid Function Tests: No results for input(s): TSH, T4TOTAL, FREET4, T3FREE, THYROIDAB in the last 72 hours. Anemia Panel: No results for input(s): VITAMINB12, FOLATE, FERRITIN, TIBC, IRON, RETICCTPCT in the last 72 hours. Sepsis Labs: Recent Labs  Lab 02/05/18 2052  LATICACIDVEN 0.92    Recent Results (from the past 240 hour(s))  Blood Culture (routine x 2)     Status: Abnormal (Preliminary result)   Collection Time:  02/05/18  8:45 PM  Result Value Ref Range Status   Specimen Description   Final    BLOOD BLOOD RIGHT FOREARM Performed at [facility], [address] [place], [place] [address]    Special Requests   Final    BOTTLES DRAWN AEROBIC AND ANAEROBIC Blood Culture adequate volume Performed at [facility], [address] [place], [place] [address]    Culture  Setup Time   Final    GRAM NEGATIVE RODS AEROBIC BOTTLE ONLY CRITICAL RESULT CALLED TO, READ BACK BY AND VERIFIED WITH: B GREEN PHARMD [id number] 02/07/18 A BROWNING Performed at [facility], [address], [place], [place] [address]    Culture KLEBSIELLA PNEUMONIAE (A)  Final   Report Status PENDING  Incomplete  Blood Culture ID Panel (Reflexed)     Status: Abnormal   Collection Time: 02/05/18  8:45 PM  Result Value Ref Range  Status   Enterococcus species NOT DETECTED NOT DETECTED Final   Listeria monocytogenes NOT DETECTED NOT DETECTED Final   Staphylococcus species NOT DETECTED NOT DETECTED Final   Staphylococcus aureus (BCID) NOT DETECTED NOT DETECTED Final   Streptococcus species NOT DETECTED NOT DETECTED Final   Streptococcus agalactiae NOT DETECTED NOT DETECTED Final   Streptococcus pneumoniae NOT DETECTED NOT DETECTED Final   Streptococcus pyogenes NOT DETECTED NOT DETECTED Final   Acinetobacter baumannii NOT DETECTED NOT DETECTED Final   Enterobacteriaceae species DETECTED (A) NOT DETECTED Final    Comment: Enterobacteriaceae represent a large family of gram-negative bacteria, not a single organism. CRITICAL RESULT CALLED TO, READ BACK BY AND VERIFIED WITH: B GREEN PHARMD 0322 02/07/18 A BROWNING    Enterobacter cloacae complex NOT DETECTED NOT DETECTED Final   Escherichia coli NOT DETECTED NOT DETECTED Final   Klebsiella oxytoca NOT DETECTED NOT DETECTED Final   Klebsiella pneumoniae DETECTED (A) NOT DETECTED Final    Comment: CRITICAL RESULT CALLED TO, READ BACK BY AND  VERIFIED WITH: B GREEN PHARMD 0322 02/07/18 A BROWNING    Proteus species NOT DETECTED NOT DETECTED Final   Serratia marcescens NOT DETECTED NOT DETECTED Final   Carbapenem resistance NOT DETECTED NOT DETECTED Final   Haemophilus influenzae NOT DETECTED NOT DETECTED Final   Neisseria meningitidis NOT DETECTED NOT DETECTED Final   Pseudomonas aeruginosa NOT DETECTED NOT DETECTED Final   Candida albicans NOT DETECTED NOT DETECTED Final   Candida glabrata NOT DETECTED NOT DETECTED Final   Candida krusei NOT DETECTED NOT DETECTED Final   Candida parapsilosis NOT DETECTED NOT DETECTED Final   Candida tropicalis NOT DETECTED NOT DETECTED Final    Comment: Performed at [facility], [address] [place], [place] [address]  Blood Culture (routine x 2)     Status: None (Preliminary result)   Collection Time: 02/05/18  9:01 PM  Result Value Ref Range Status   Specimen Description   Final    BLOOD BLOOD LEFT FOREARM Performed at [facility], [address] [place], [place] [address]    Special Requests   Final    BOTTLES DRAWN AEROBIC AND ANAEROBIC Blood Culture adequate volume Performed at [facility], [address] [place], [place] [address]    Culture   Final    NO GROWTH 3 DAYS Performed at [facility], [address], [place], [place] [address]    Report Status PENDING  Incomplete  Urine culture     Status: Abnormal (Preliminary result)   Collection Time: 02/05/18  9:30 PM  Result Value Ref Range Status   Specimen Description URINE, CLEAN CATCH  Final   Special Requests Normal  Final   Culture >=100,000 COLONIES/mL ENTEROBACTER AEROGENES (A)  Final   Report Status PENDING  Incomplete   Organism ID, Bacteria ENTEROBACTER AEROGENES (A)  Final      Susceptibility   Enterobacter aerogenes - MIC*    CEFAZOLIN RESISTANT Resistant     CEFTRIAXONE <=1 SENSITIVE Sensitive     CIPROFLOXACIN <=0.25 SENSITIVE Sensitive      GENTAMICIN <=1 SENSITIVE Sensitive     IMIPENEM <=0.25 SENSITIVE Sensitive     NITROFURANTOIN <=16 SENSITIVE Sensitive     TRIMETH/SULFA <=20 SENSITIVE Sensitive     PIP/TAZO Value in next row Sensitive      <=4 SENSITIVEPerformed at [facility], [address], [place], [place] [address]    * >=100,000 COLONIES/mL ENTEROBACTER AEROGENES  Radiology Studies: Ct Abdomen Pelvis Wo Contrast  Result Date: 02/06/2018 CLINICAL DATA:  UTI with high grade fever. EXAM: CT ABDOMEN AND PELVIS WITHOUT CONTRAST TECHNIQUE: Multidetector CT imaging of the abdomen and pelvis was performed following the standard protocol without IV contrast. COMPARISON:  None. FINDINGS: Lower chest: Bilateral lower lobe atelectasis versus airspace consolidation. Hepatobiliary: No focal liver abnormality is seen. Status post cholecystectomy. No biliary dilatation. Pancreas: Unremarkable. No pancreatic ductal dilatation or surrounding inflammatory changes. Spleen: Normal in size without focal abnormality. Adrenals/Urinary Tract: Normal appearance of the adrenal glands. Mild right perinephric stranding. Moderate left perinephric fat stranding. There is a large nonobstructive calculus in the upper pole of the left kidney which measures 2.1 cm. No evidence of hydronephrosis or hydroureter. Stomach/Bowel: Stomach is within normal limits. Appendix appears normal. No evidence of bowel wall thickening, distention, or inflammatory changes. Vascular/Lymphatic: Aortic atherosclerosis. No enlarged abdominal or pelvic lymph nodes. Reproductive: Leiomyomatous uterus. Other: Small wide neck fat and bowel containing periumbilical anterior abdominal wall hernia. No evidence of strangulation. No evidence of abdominal ascites. Musculoskeletal: Lower lumbosacral spine fusion with intact hardware. IMPRESSION: Bilateral inflammatory perinephric fat stranding, much more prominent on the left. Large nonobstructive 2.1 cm calculus in the upper pole of  the left kidney. No evidence of hydronephrosis or hydroureter. Small wide neck fat and bowel containing periumbilical anterior abdominal wall hernia. No evidence of strangulation. Bilateral lower lobe pulmonary atelectasis versus airspace consolidation. Leiomyomatous uterus. Intact lower lumbosacral spine fusion. Electronically Signed   By: Ted Mcalpine M.D.   On: 02/06/2018 19:01   Dg Chest Port 1 View  Result Date: 02/06/2018 CLINICAL DATA:  Fever, fatigue and confusion. EXAM: PORTABLE CHEST 1 VIEW COMPARISON:  02/05/2018 FINDINGS: Heart size is normal. Aortic atherosclerosis as seen previously. Abnormal density in both lower lobes consistent with pneumonia. There could be an element of chronic scarring coexistent. The upper lungs are clear. No visible effusions. IMPRESSION: No change since yesterday. Abnormal markings at both lung bases consistent with pneumonia. Without older comparison films, one could not exclude that some of this density could be chronic scarring, but pneumonia is favored. Electronically Signed   By: Paulina Fusi M.D.   On: 02/06/2018 17:45    Scheduled Meds: . ezetimibe  10 mg Oral Daily  . feeding supplement (ENSURE ENLIVE)  237 mL Oral BID BM  . glipiZIDE  10 mg Oral Daily  . heparin  5,000 Units Subcutaneous Q8H  . insulin aspart  0-20 Units Subcutaneous TID WC  . insulin glargine  10 Units Subcutaneous QHS  . levothyroxine  88 mcg Oral Daily  . lisinopril  20 mg Oral Daily  . multivitamin with minerals  1 tablet Oral Daily  . simvastatin  40 mg Oral QHS   Continuous Infusions: . sodium chloride 75 mL/hr at 02/08/18 0400  . meropenem (MERREM) IV 1 g (02/08/18 0609)     LOS: 3 days   Time spent: Total of 15 minutes spent with pt, greater than 50% of which was spent in discussion of  treatment, counseling and coordination of care   Latrelle Dodrill, MD Pager: Text Page via [email or web address]   If 7PM-7AM, please contact  night-coverage [email or web address] 02/08/2018, 2:06 PM   Note - This record has been created using [employer]. Chart creation errors have been sought, but may not always have been located. Such creation errors do not reflect on the standard of medical care.

## 2018-02-09 DIAGNOSIS — J13 Pneumonia due to Streptococcus pneumoniae: Secondary | ICD-10-CM

## 2018-02-09 LAB — BASIC METABOLIC PANEL
Anion gap: 8 (ref 5–15)
BUN: 16 mg/dL (ref 8–23)
CHLORIDE: 104 mmol/L (ref 98–111)
CO2: 26 mmol/L (ref 22–32)
Calcium: 8.3 mg/dL — ABNORMAL LOW (ref 8.9–10.3)
Creatinine, Ser: 1.08 mg/dL — ABNORMAL HIGH (ref 0.44–1.00)
GFR calc Af Amer: 57 mL/min — ABNORMAL LOW (ref >60)
GFR calc non Af Amer: 49 mL/min — ABNORMAL LOW (ref >60)
Glucose, Bld: 213 mg/dL — ABNORMAL HIGH (ref 70–99)
POTASSIUM: 3.9 mmol/L (ref 3.5–5.1)
Sodium: 138 mmol/L (ref 135–145)

## 2018-02-09 LAB — CBC WITH DIFFERENTIAL/PLATELET
Abs Immature Granulocytes: 0.26 10*3/uL — ABNORMAL HIGH (ref 0.00–0.07)
Basophils Absolute: 0 10*3/uL (ref 0.0–0.1)
Basophils Relative: 0 %
EOS ABS: 0.2 10*3/uL (ref 0.0–0.5)
EOS PCT: 2 %
HEMATOCRIT: 28.5 % — AB (ref 36.0–46.0)
Hemoglobin: 9.2 g/dL — ABNORMAL LOW (ref 12.0–15.0)
Immature Granulocytes: 2 %
Lymphocytes Relative: 22 %
Lymphs Abs: 2.6 10*3/uL (ref 0.7–4.0)
MCH: 27.9 pg (ref 26.0–34.0)
MCHC: 32.3 g/dL (ref 30.0–36.0)
MCV: 86.4 fL (ref 80.0–100.0)
MONO ABS: 0.8 10*3/uL (ref 0.1–1.0)
Monocytes Relative: 7 %
NEUTROS ABS: 7.9 10*3/uL — AB (ref 1.7–7.7)
Neutrophils Relative %: 67 %
Platelets: 454 10*3/uL — ABNORMAL HIGH (ref 150–400)
RBC: 3.3 MIL/uL — ABNORMAL LOW (ref 3.87–5.11)
RDW: 14.2 % (ref 11.5–15.5)
WBC: 11.8 10*3/uL — ABNORMAL HIGH (ref 4.0–10.5)
nRBC: 0 % (ref 0.0–0.2)

## 2018-02-09 LAB — CULTURE, BLOOD (ROUTINE X 2): Special Requests: ADEQUATE

## 2018-02-09 LAB — GLUCOSE, CAPILLARY
GLUCOSE-CAPILLARY: 235 mg/dL — AB (ref 70–99)
Glucose-Capillary: 247 mg/dL — ABNORMAL HIGH (ref 70–99)

## 2018-02-09 LAB — URINE CULTURE: SPECIAL REQUESTS: NORMAL

## 2018-02-09 MED ORDER — METFORMIN HCL 1000 MG PO TABS: 1000.0000 mg | ORAL_TABLET | Freq: Two times a day (BID) | ORAL | 0 refills | Status: AC

## 2018-02-09 MED ORDER — GUAIFENESIN-DM 100-10 MG/5ML PO SYRP: 5.0000 mL | ORAL_SOLUTION | ORAL | 0 refills | Status: AC | PRN

## 2018-02-09 MED ORDER — GLIPIZIDE ER 10 MG PO TB24: 10.0000 mg | ORAL_TABLET | Freq: Every day | ORAL | 0 refills | Status: AC

## 2018-02-09 MED ORDER — CEFPODOXIME PROXETIL 200 MG PO TABS: 200.0000 mg | ORAL_TABLET | Freq: Two times a day (BID) | ORAL | 0 refills | Status: AC

## 2018-02-09 NOTE — Discharge Summary (Signed)
Physician Discharge Summary  Patty Jones  NWG:[id number]  DOB: 1945-10-13  DOA: 02/05/2018 PCP: System, Pcp Not In  Admit date: 02/05/2018 Discharge date: 02/09/2018  Admitted From: Home  Disposition: Home   Recommendations for Outpatient Follow-up:  1. Follow up with PCP in 1 to monitor glucose 2. Please obtain BMP/CBC in one week to monitor renal function and hemoglobin 3. Please follow up on the following pending results: Final repeated blood cultures  Discharge Condition: Stable CODE STATUS: Full code Diet recommendation: Heart Healthy / Carb Modified  Brief/Interim Summary: For full details see H&P/Progress note, but in brief, Patty Jones is a 72 year old female with medical history significant of diabetes mellitus type 2 and hypertension, presented to the emergency department complaining of fever, fatigue and confusion.  Apparently patient have decreased p.o. intake since Friday with intermittent fevers and elevated blood glucose.  Upon ED evaluation chest x-ray was negative, UA was grossly abnormal and lab work-up reveals creatinine of 1.7 BUN 51 WBC 14.3.  Patient was afebrile T-max 101.5.  Patient was admitted with working diagnosis of sepsis secondary to UTI.  Subjective: Patient seen and examined, has no complaints today feeling much better.  Up in chair.  Denies nausea, vomiting and abdominal pain.  Remains afebrile.  Tolerating diet well.  Discharge Diagnoses/Hospital Course:  Sepsis secondary to UTI/Pyelonephritis    Sepsis physiology resolved. UA grossly abnormal, urine culture shows enterobacter aerogenes.  Evidence of pyelonephritis on CT scan with perinephric fat stranding.  Initially treated with Rocephin, subsequently switched to meropenem, sensitivities showed only resistant to cefazolin.  Patient will be discharged on Vantin to complete total of 14 days.  Follow-up with PCP  Klebsiella bacteremia, likely from pneumonia  CT abd/pelvis and CXR show signs of  pneumonia, blood cultures with Klebsiella pneumoniae, resistant to ampicillin and cefazolin, was treated with Merrem, sensitive to ceftriaxone therefore will discharge on Vantin twice daily to complete total of 14 days.  WBC improving, remains afebrile.  Repeated blood culture 10/19 pending.  AKI Secondary to poor oral intake and infectious process.  Creatinine back to baseline, patient was treated with IV fluids.  Encourage oral hydration.  Avoid nephrotoxic agent, will discontinue meloxicam.  Check renal function in 1 week.  DM type II with hyperglycemia Per patient blood glucose well controlled at home, surprised of hyperglycemia suspect related to infectious process.  A1c was checked and is 9.4.  Patient was treated with Lantus and insulin sliding scale while in hospital.  Upon discharge glipizide was increased to 10 mg daily and metformin increased to thousand milligrams twice daily.  Will resume Tradjenta and advised to follow-up with PCP for further glucose monitoring and adjustment of medication as needed.  Hypertension Blood pressure slightly above goal, in view of setting.  Will continue antihypertensive medications at current dose with no changes at this time.  Follow-up with PCP to monitor BP  All other chronic medical condition were stable during the hospitalization.  On the day of the discharge the patient's vitals were stable, and no other acute medical condition were reported by patient. the patient was felt safe to be discharge to home.   Discharge Instructions  You were cared for by a hospitalist during your hospital stay. If you have any questions about your discharge medications or the care you received while you were in the hospital after you are discharged, you can call the unit and asked to speak with the hospitalist on call if the hospitalist that took care of you is not  available. Once you are discharged, your primary care physician will handle any further medical issues.  Please note that NO REFILLS for any discharge medications will be authorized once you are discharged, as it is imperative that you return to your primary care physician (or establish a relationship with a primary care physician if you do not have one) for your aftercare needs so that they can reassess your need for medications and monitor your lab values.  Discharge Instructions    Call MD for:  difficulty breathing, headache or visual disturbances   Complete by:  As directed    Call MD for:  extreme fatigue   Complete by:  As directed    Call MD for:  hives   Complete by:  As directed    Call MD for:  persistant dizziness or light-headedness   Complete by:  As directed    Call MD for:  persistant nausea and vomiting   Complete by:  As directed    Call MD for:  redness, tenderness, or signs of infection (pain, swelling, redness, odor or green/yellow discharge around incision site)   Complete by:  As directed    Call MD for:  severe uncontrolled pain   Complete by:  As directed    Call MD for:  temperature >100.4   Complete by:  As directed    Diet - low sodium heart healthy   Complete by:  As directed    Increase activity slowly   Complete by:  As directed      Allergies as of 02/09/2018   No Known Allergies     Medication List    STOP taking these medications   meloxicam 7.5 MG tablet Commonly known as:  MOBIC     TAKE these medications   cefpodoxime 200 MG tablet Commonly known as:  VANTIN Take 1 tablet (200 mg total) by mouth 2 (two) times daily for 10 days.   ezetimibe 10 MG tablet Commonly known as:  ZETIA Take 10 mg by mouth daily.   Fish Oil 1000 MG Caps Take 1,000 mg by mouth daily.   glipiZIDE 10 MG 24 hr tablet Commonly known as:  GLUCOTROL XL Take 1 tablet (10 mg total) by mouth daily. Start taking on:  02/10/2018 What changed:  medication strength   guaiFENesin-dextromethorphan 100-10 MG/5ML syrup Commonly known as:  ROBITUSSIN DM Take 5 mLs by  mouth every 4 (four) hours as needed for cough (chest congestion).   JOINT SUPPORT Caps Take 1 capsule by mouth daily.   levothyroxine 88 MCG tablet Commonly known as:  SYNTHROID, LEVOTHROID Take 88 mcg by mouth daily.   lisinopril 20 MG tablet Commonly known as:  PRINIVIL,ZESTRIL Take 20 mg by mouth daily.   metFORMIN 1000 MG tablet Commonly known as:  GLUCOPHAGE Take 1 tablet (1,000 mg total) by mouth 2 (two) times daily. What changed:    medication strength  how much to take   MULTIVITAMIN WOMEN 50+ Tabs Take 1 tablet by mouth daily.   simvastatin 40 MG tablet Commonly known as:  ZOCOR Take 40 mg by mouth at bedtime.   TRADJENTA 5 MG Tabs tablet Generic drug:  linagliptin Take 5 mg by mouth daily.      Follow-up Information    PCP. Schedule an appointment as soon as possible for a visit in 1 week(s).   Why:  Hospital follow up , monitor blood glucose and blood pressure          No Known Allergies  Consultations:  None    Procedures/Studies: Ct Abdomen Pelvis Wo Contrast  Result Date: 02/06/2018 CLINICAL DATA:  UTI with high grade fever. EXAM: CT ABDOMEN AND PELVIS WITHOUT CONTRAST TECHNIQUE: Multidetector CT imaging of the abdomen and pelvis was performed following the standard protocol without IV contrast. COMPARISON:  None. FINDINGS: Lower chest: Bilateral lower lobe atelectasis versus airspace consolidation. Hepatobiliary: No focal liver abnormality is seen. Status post cholecystectomy. No biliary dilatation. Pancreas: Unremarkable. No pancreatic ductal dilatation or surrounding inflammatory changes. Spleen: Normal in size without focal abnormality. Adrenals/Urinary Tract: Normal appearance of the adrenal glands. Mild right perinephric stranding. Moderate left perinephric fat stranding. There is a large nonobstructive calculus in the upper pole of the left kidney which measures 2.1 cm. No evidence of hydronephrosis or hydroureter. Stomach/Bowel: Stomach is  within normal limits. Appendix appears normal. No evidence of bowel wall thickening, distention, or inflammatory changes. Vascular/Lymphatic: Aortic atherosclerosis. No enlarged abdominal or pelvic lymph nodes. Reproductive: Leiomyomatous uterus. Other: Small wide neck fat and bowel containing periumbilical anterior abdominal wall hernia. No evidence of strangulation. No evidence of abdominal ascites. Musculoskeletal: Lower lumbosacral spine fusion with intact hardware. IMPRESSION: Bilateral inflammatory perinephric fat stranding, much more prominent on the left. Large nonobstructive 2.1 cm calculus in the upper pole of the left kidney. No evidence of hydronephrosis or hydroureter. Small wide neck fat and bowel containing periumbilical anterior abdominal wall hernia. No evidence of strangulation. Bilateral lower lobe pulmonary atelectasis versus airspace consolidation. Leiomyomatous uterus. Intact lower lumbosacral spine fusion. Electronically Signed   By: Ted Mcalpine M.D.   On: 02/06/2018 19:01   Dg Chest 2 View  Result Date: 02/05/2018 CLINICAL DATA:  Fever and cough with lethargy since Friday. EXAM: CHEST - 2 VIEW COMPARISON:  None. FINDINGS: Low lung volumes with bibasilar subsegmental atelectasis. Heart is normal in size. Mild aortic atherosclerosis is noted at the arch without aneurysm. No acute osseous abnormality is seen. IMPRESSION: Low lung volumes with streaky subsegmental atelectasis noted at each base. Electronically Signed   By: Tollie Eth M.D.   On: 02/05/2018 21:58   Dg Chest Port 1 View  Result Date: 02/06/2018 CLINICAL DATA:  Fever, fatigue and confusion. EXAM: PORTABLE CHEST 1 VIEW COMPARISON:  02/05/2018 FINDINGS: Heart size is normal. Aortic atherosclerosis as seen previously. Abnormal density in both lower lobes consistent with pneumonia. There could be an element of chronic scarring coexistent. The upper lungs are clear. No visible effusions. IMPRESSION: No change since  yesterday. Abnormal markings at both lung bases consistent with pneumonia. Without older comparison films, one could not exclude that some of this density could be chronic scarring, but pneumonia is favored. Electronically Signed   By: Paulina Fusi M.D.   On: 02/06/2018 17:45    Discharge Exam: Vitals:   02/08/18 2021 02/09/18 0426  BP: (!) 156/71 (!) 150/78  Pulse: 83 80  Resp: 20 18  Temp: 98.7 F (37.1 C) 98.8 F (37.1 C)  SpO2: 96% 96%   Vitals:   02/08/18 0804 02/08/18 1346 02/08/18 2021 02/09/18 0426  BP:  (!) 146/77 (!) 156/71 (!) 150/78  Pulse:  84 83 80  Resp:  18 20 18   Temp:  98 F (36.7 C) 98.7 F (37.1 C) 98.8 F (37.1 C)  TempSrc:  Oral Oral Oral  SpO2: 95% 100% 96% 96%    General: Pt is alert, awake, not in acute distress Cardiovascular: RRR, S1/S2 +, no rubs, no gallops Respiratory: CTA bilaterally, no wheezing, no rhonchi Abdominal: Soft, NT, ND, bowel  sounds + Extremities: no edema, no cyanosis  The results of significant diagnostics from this hospitalization (including imaging, microbiology, ancillary and laboratory) are listed below for reference.     Microbiology: Recent Results (from the past 240 hour(s))  Blood Culture (routine x 2)     Status: Abnormal   Collection Time: 02/05/18  8:45 PM  Result Value Ref Range Status   Specimen Description   Final    BLOOD BLOOD RIGHT FOREARM Performed at [facility], [address] [place], [place] [address]    Special Requests   Final    BOTTLES DRAWN AEROBIC AND ANAEROBIC Blood Culture adequate volume Performed at [facility], [address] [place], [place] [address]    Culture  Setup Time   Final    GRAM NEGATIVE RODS AEROBIC BOTTLE ONLY CRITICAL RESULT CALLED TO, READ BACK BY AND VERIFIED WITH: B GREEN PHARMD 2130 02/07/18 A BROWNING Performed at [facility], [address] [place], [place] [address]    Culture KLEBSIELLA PNEUMONIAE (A)  Final    Report Status 02/09/2018 FINAL  Final   Organism ID, Bacteria KLEBSIELLA PNEUMONIAE  Final      Susceptibility   Klebsiella pneumoniae - MIC*    AMPICILLIN RESISTANT Resistant     CEFAZOLIN >=64 RESISTANT Resistant     CEFEPIME <=1 SENSITIVE Sensitive     CEFTAZIDIME <=1 SENSITIVE Sensitive     CEFTRIAXONE <=1 SENSITIVE Sensitive     CIPROFLOXACIN <=0.25 SENSITIVE Sensitive     GENTAMICIN <=1 SENSITIVE Sensitive     IMIPENEM 0.5 SENSITIVE Sensitive     TRIMETH/SULFA <=20 SENSITIVE Sensitive     AMPICILLIN/SULBACTAM 4 SENSITIVE Sensitive     PIP/TAZO <=4 SENSITIVE Sensitive     Extended ESBL NEGATIVE Sensitive     * KLEBSIELLA PNEUMONIAE  Blood Culture ID Panel (Reflexed)     Status: Abnormal   Collection Time: 02/05/18  8:45 PM  Result Value Ref Range Status   Enterococcus species NOT DETECTED NOT DETECTED Final   Listeria monocytogenes NOT DETECTED NOT DETECTED Final   Staphylococcus species NOT DETECTED NOT DETECTED Final   Staphylococcus aureus (BCID) NOT DETECTED NOT DETECTED Final   Streptococcus species NOT DETECTED NOT DETECTED Final   Streptococcus agalactiae NOT DETECTED NOT DETECTED Final   Streptococcus pneumoniae NOT DETECTED NOT DETECTED Final   Streptococcus pyogenes NOT DETECTED NOT DETECTED Final   Acinetobacter baumannii NOT DETECTED NOT DETECTED Final   Enterobacteriaceae species DETECTED (A) NOT DETECTED Final    Comment: Enterobacteriaceae represent a large family of gram-negative bacteria, not a single organism. CRITICAL RESULT CALLED TO, READ BACK BY AND VERIFIED WITH: B GREEN PHARMD 0322 02/07/18 A BROWNING    Enterobacter cloacae complex NOT DETECTED NOT DETECTED Final   Escherichia coli NOT DETECTED NOT DETECTED Final   Klebsiella oxytoca NOT DETECTED NOT DETECTED Final   Klebsiella pneumoniae DETECTED (A) NOT DETECTED Final    Comment: CRITICAL RESULT CALLED TO, READ BACK BY AND VERIFIED WITH: B GREEN PHARMD 0322 02/07/18 A BROWNING    Proteus  species NOT DETECTED NOT DETECTED Final   Serratia marcescens NOT DETECTED NOT DETECTED Final   Carbapenem resistance NOT DETECTED NOT DETECTED Final   Haemophilus influenzae NOT DETECTED NOT DETECTED Final   Neisseria meningitidis NOT DETECTED NOT DETECTED Final   Pseudomonas aeruginosa NOT DETECTED NOT DETECTED Final   Candida albicans NOT DETECTED NOT DETECTED Final   Candida glabrata NOT DETECTED NOT DETECTED Final   Candida  krusei NOT DETECTED NOT DETECTED Final   Candida parapsilosis NOT DETECTED NOT DETECTED Final   Candida tropicalis NOT DETECTED NOT DETECTED Final    Comment: Performed at [facility], [address], [place], [place] [address]  Blood Culture (routine x 2)     Status: None (Preliminary result)   Collection Time: 02/05/18  9:01 PM  Result Value Ref Range Status   Specimen Description   Final    BLOOD BLOOD LEFT FOREARM Performed at [facility], [address], [place], [place] [address]    Special Requests   Final    BOTTLES DRAWN AEROBIC AND ANAEROBIC Blood Culture adequate volume Performed at [facility], [address], [place], [place] [address]    Culture   Final    NO GROWTH 3 DAYS Performed at [facility], [address], [place], [place] [address]    Report Status PENDING  Incomplete  Urine culture     Status: Abnormal   Collection Time: 02/05/18  9:30 PM  Result Value Ref Range Status   Specimen Description   Final    URINE, CLEAN CATCH Performed at [facility], [address] [place], [place] [address]    Special Requests   Final    Normal Performed at [facility], [address] [place], [place] [address]    Culture >=100,000 COLONIES/mL ENTEROBACTER AEROGENES (A)  Final   Report Status 02/09/2018 FINAL  Final   Organism ID, Bacteria ENTEROBACTER AEROGENES (A)  Final      Susceptibility   Enterobacter aerogenes - MIC*    CEFAZOLIN RESISTANT  Resistant     CEFTRIAXONE <=1 SENSITIVE Sensitive     CIPROFLOXACIN <=0.25 SENSITIVE Sensitive     GENTAMICIN <=1 SENSITIVE Sensitive     IMIPENEM <=0.25 SENSITIVE Sensitive     NITROFURANTOIN <=16 SENSITIVE Sensitive     TRIMETH/SULFA <=20 SENSITIVE Sensitive     PIP/TAZO <=4 SENSITIVE Sensitive     * >=100,000 COLONIES/mL ENTEROBACTER AEROGENES     Labs: BNP (last 3 results) No results for input(s): BNP in the last 8760 hours. Basic Metabolic Panel: Recent Labs  Lab 02/05/18 2056 02/06/18 0557 02/07/18 0544 02/08/18 0554 02/09/18 0535  NA 135 138 135 136 138  K 4.2 4.2 4.2 4.2 3.9  CL 100 105 104 104 104  CO2 23 22 22 24 26   GLUCOSE 360* 273* 280* 264* 213*  BUN 51* 47* 34* 23 16  CREATININE 1.74* 1.57* 1.37* 1.19* 1.08*  CALCIUM 8.7* 8.3* 8.2* 8.2* 8.3*  MG  --   --  2.0  --   --    Liver Function Tests: Recent Labs  Lab 02/05/18 2056  AST 17  ALT 21  ALKPHOS 92  BILITOT 0.8  PROT 7.1  ALBUMIN 3.0*   Recent Labs  Lab 02/05/18 2056  LIPASE 38   No results for input(s): AMMONIA in the last 168 hours. CBC: Recent Labs  Lab 02/05/18 2056 02/06/18 0557 02/07/18 0544 02/08/18 0554 02/09/18 0535  WBC 14.3* 14.6* 14.9* 13.2* 11.8*  NEUTROABS 11.2*  --  11.0* 9.4* 7.9*  HGB 10.4* 9.6* 9.3* 9.5* 9.2*  HCT 31.9* 29.7* 29.2* 29.6* 28.5*  MCV 84.4 86.6 85.9 85.5 86.4  PLT 297 284 313 397 454*   Cardiac Enzymes: Recent Labs  Lab 02/05/18 2056  TROPONINI <0.03   BNP: Invalid input(s): POCBNP CBG: Recent Labs  Lab 02/08/18 0750 02/08/18 1136 02/08/18 1646 02/08/18  2018 02/09/18 0751  GLUCAP 244* 244* 309* 240* 235*   D-Dimer No results for input(s): DDIMER in the last 72 hours. Hgb A1c Recent Labs    02/07/18 0544  HGBA1C 9.4*   Lipid Profile No results for input(s): CHOL, HDL, LDLCALC, TRIG, CHOLHDL, LDLDIRECT in the last 72 hours. Thyroid function studies No results for input(s): TSH, T4TOTAL, T3FREE, THYROIDAB in the last 72  hours.  Invalid input(s): FREET3 Anemia work up No results for input(s): VITAMINB12, FOLATE, FERRITIN, TIBC, IRON, RETICCTPCT in the last 72 hours. Urinalysis    Component Value Date/Time   COLORURINE AMBER (A) 02/05/2018 2130   APPEARANCEUR CLOUDY (A) 02/05/2018 2130   LABSPEC 1.016 02/05/2018 2130   PHURINE 5.0 02/05/2018 2130   GLUCOSEU >=500 (A) 02/05/2018 2130   HGBUR LARGE (A) 02/05/2018 2130   BILIRUBINUR NEGATIVE 02/05/2018 2130   KETONESUR 5 (A) 02/05/2018 2130   PROTEINUR 100 (A) 02/05/2018 2130   NITRITE NEGATIVE 02/05/2018 2130   LEUKOCYTESUR LARGE (A) 02/05/2018 2130     Time coordinating discharge: 35 minutes  SIGNED:  Latrelle Dodrill, MD  [facility] 02/09/2018, 11:51 AM  Pager please text page via  [email or web address]  Note - This record has been created using [employer]. Chart creation errors have been sought, but may not always have been located. Such creation errors do not reflect on the standard of medical care.

## 2018-02-09 NOTE — Progress Notes (Signed)
Pt leaving this afternoon with her spouse. Alert, oriented, and without c/o. Discharge instructions/prescriptions given/explained with pt and her spouse verbalizing understanding.  Pt aware of followup.

## 2018-02-10 LAB — CULTURE, BLOOD (ROUTINE X 2)
CULTURE: NO GROWTH
Special Requests: ADEQUATE

## 2018-02-13 LAB — CULTURE, BLOOD (ROUTINE X 2)
Culture: NO GROWTH
Culture: NO GROWTH
SPECIAL REQUESTS: ADEQUATE
Special Requests: ADEQUATE

## 2019-02-15 IMAGING — CT CT ABD-PELV W/O CM
2 of 4 series · 16 of 46 positions shown, 18 images · non-contrast
Comparison: None.

CLINICAL DATA: UTI with high grade fever.

EXAM:
CT ABDOMEN AND PELVIS WITHOUT CONTRAST
TECHNIQUE: Multidetector CT imaging of the abdomen and pelvis was performed
following the standard protocol without IV contrast.

[Series 2: axial st · axial · 0.83mm/px · z∈[+330,+780]mm · 13 of 101 slices shown, 15 images]
[im 6/101  soft-tissue]
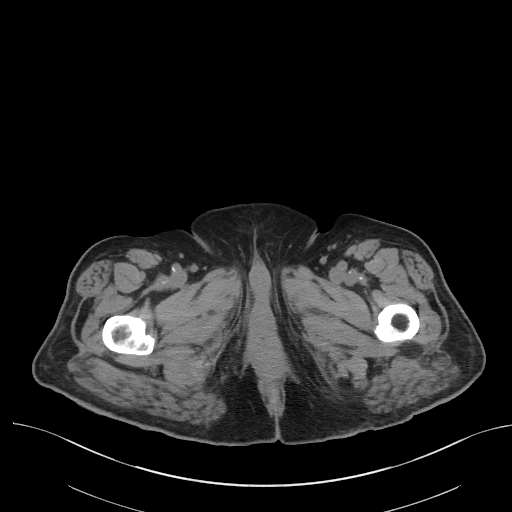
[im 6/101  bone]
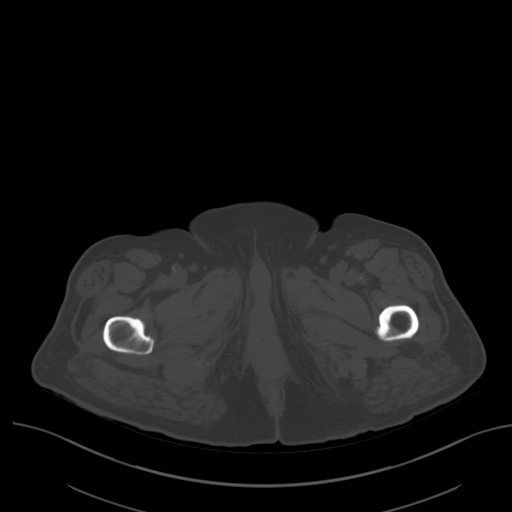
[im 16/101  soft-tissue]
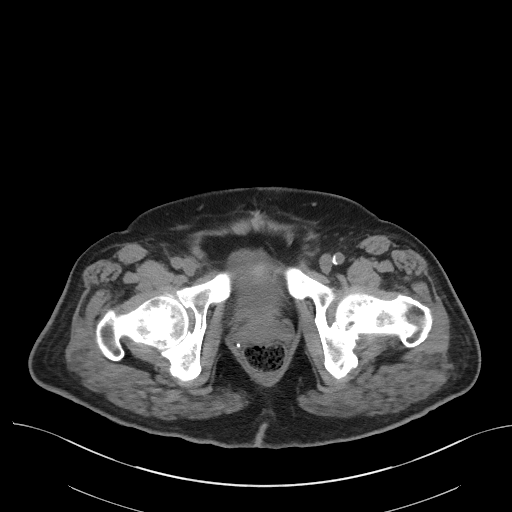
[im 21/101  soft-tissue]
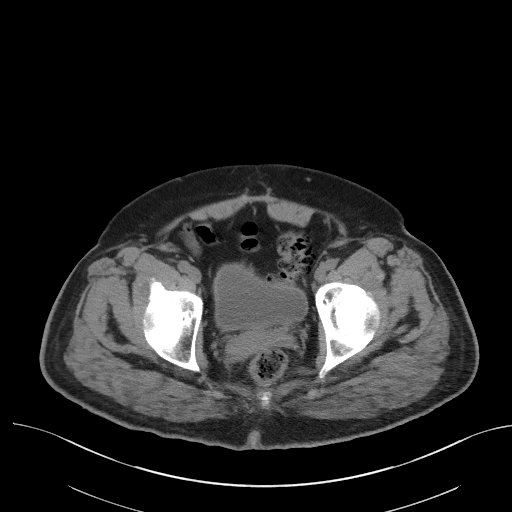
[im 31/101  soft-tissue]
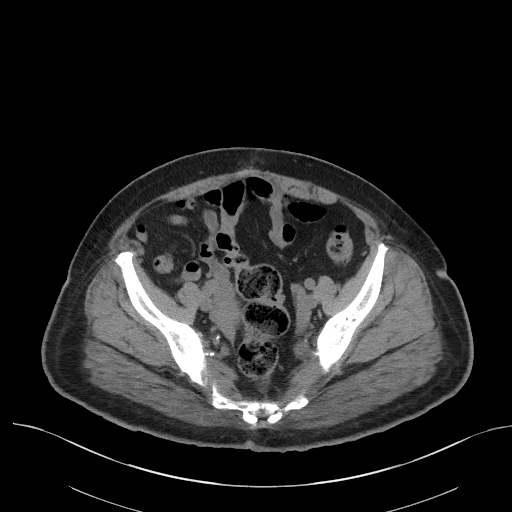
[im 36/101  soft-tissue]
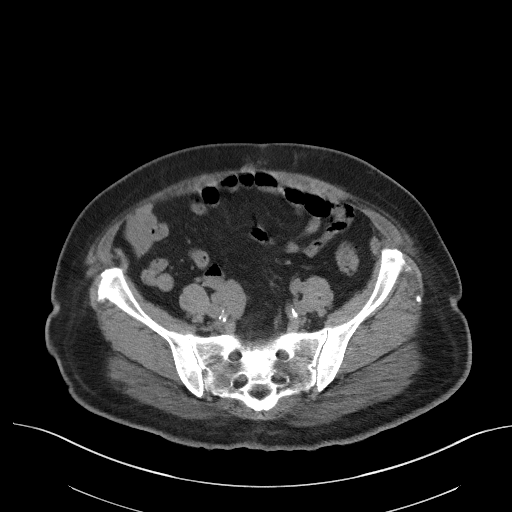
[im 46/101  soft-tissue]
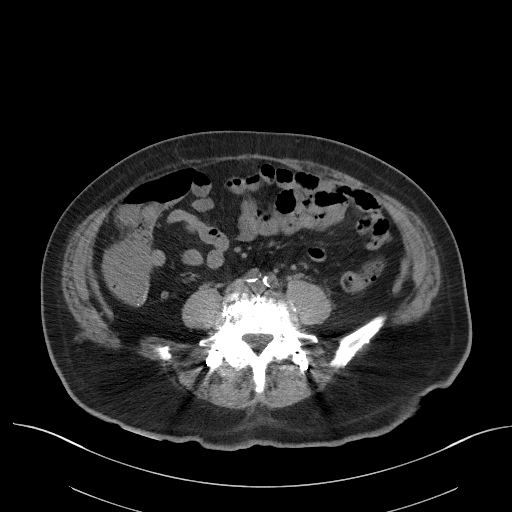
[im 51/101  soft-tissue]
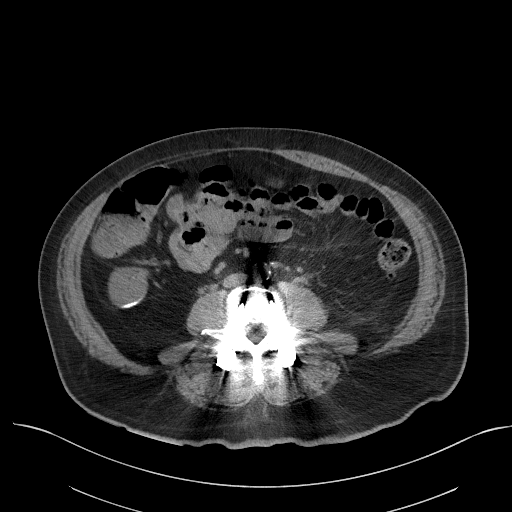
[im 56/101  soft-tissue]
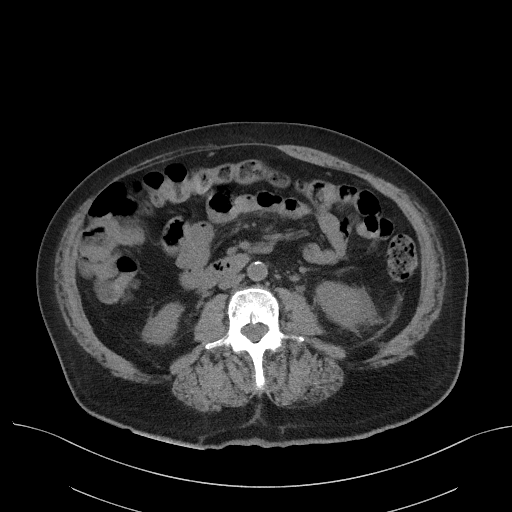
[im 66/101  soft-tissue]
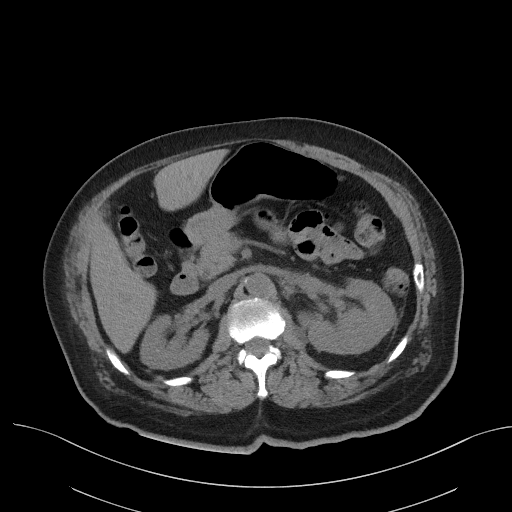
[im 66/101  bone]
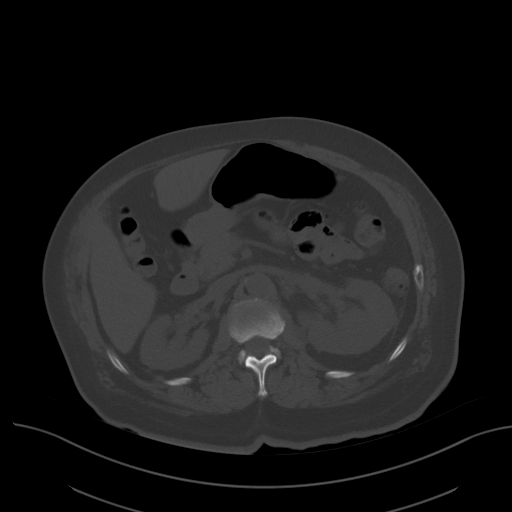
[im 71/101  soft-tissue]
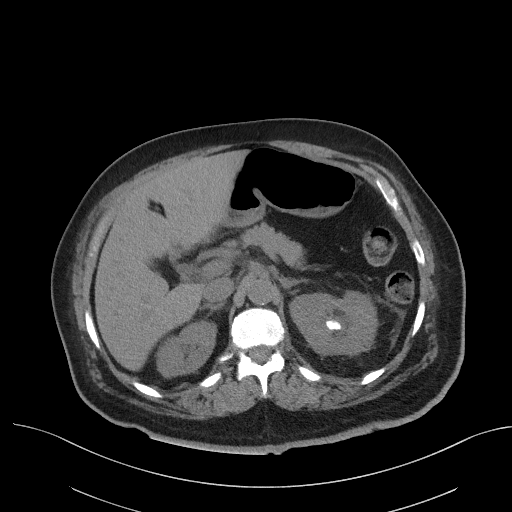
[im 81/101  soft-tissue]
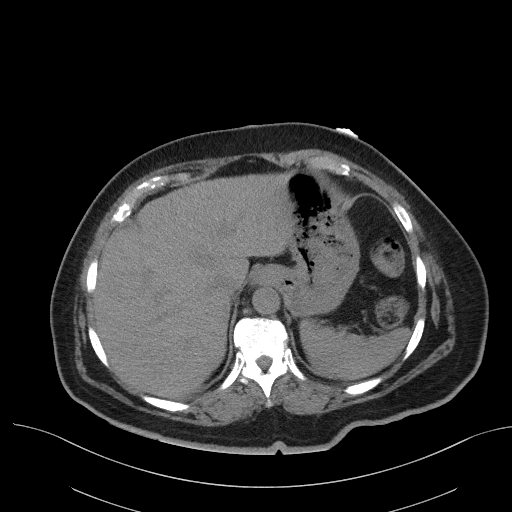
[im 86/101  soft-tissue]
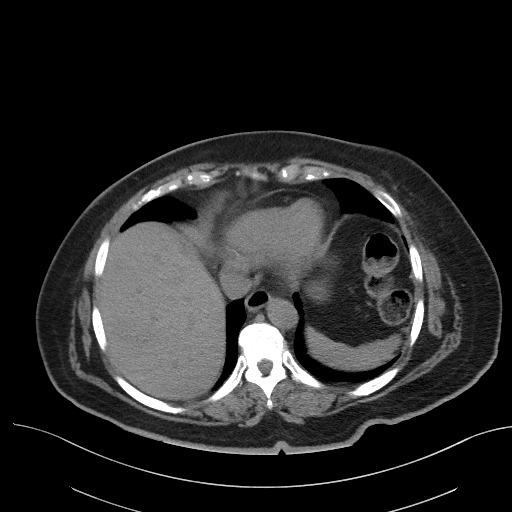
[im 96/101  soft-tissue]
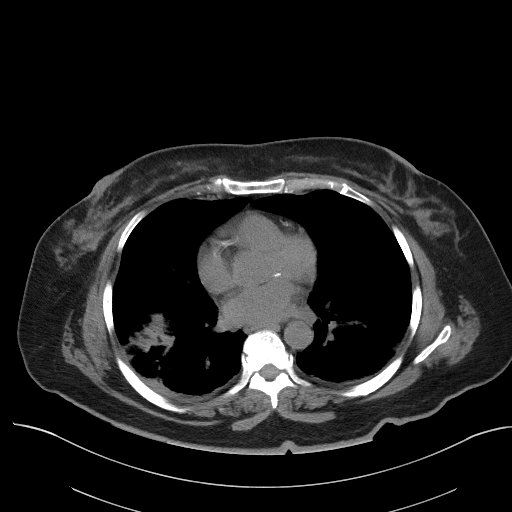

[Series 5: coronal st · coronal · 0.74mm/px · 3 of 101 slices shown]
[im 34/101  soft-tissue]
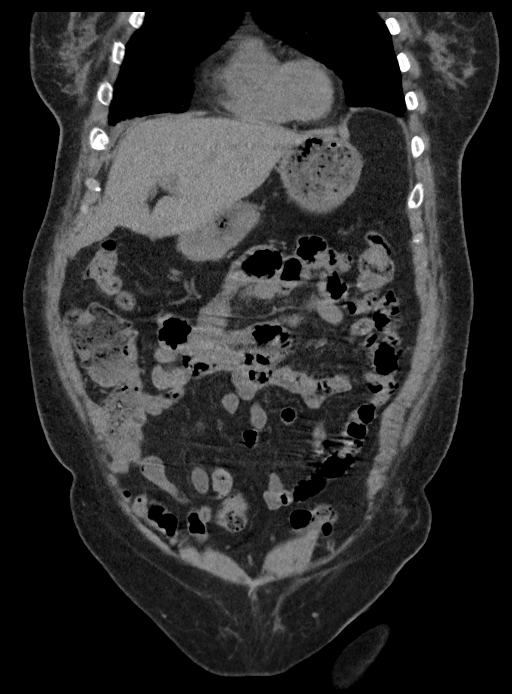
[im 45/101  soft-tissue]
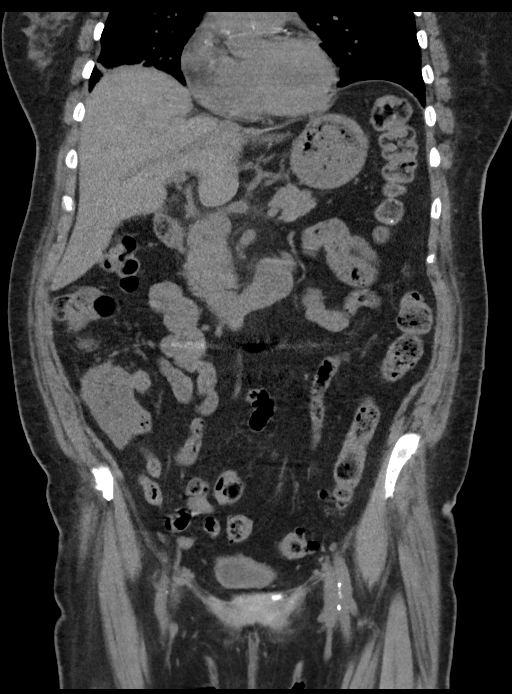
[im 56/101  soft-tissue]
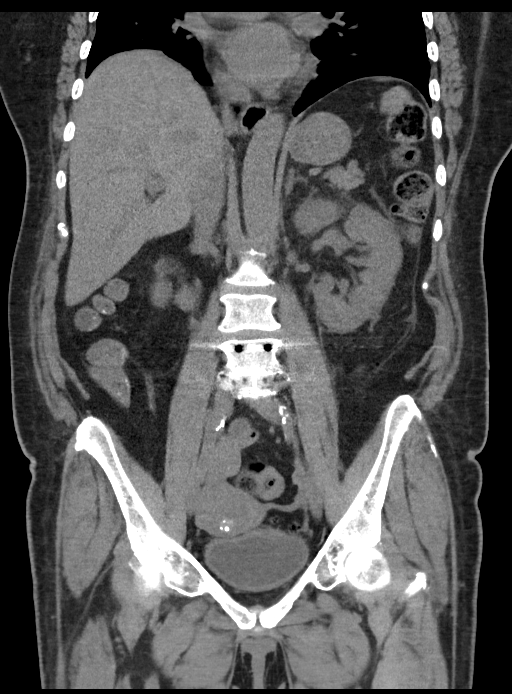

[16 of 46 positions shown; findings below may reference images not displayed]

FINDINGS: Lower chest: Bilateral lower lobe atelectasis versus airspace
consolidation.

Hepatobiliary: No focal liver abnormality is seen. Status post
cholecystectomy. No biliary dilatation.

Pancreas: Unremarkable. No pancreatic ductal dilatation or
surrounding inflammatory changes.

Spleen: Normal in size without focal abnormality.

Adrenals/Urinary Tract: Normal appearance of the adrenal glands.
Mild right perinephric stranding. Moderate left perinephric fat
stranding. There is a large nonobstructive calculus in the upper
pole of the left kidney which measures 2.1 cm. No evidence of
hydronephrosis or hydroureter.

Stomach/Bowel: Stomach is within normal limits. Appendix appears
normal. No evidence of bowel wall thickening, distention, or
inflammatory changes.

Vascular/Lymphatic: Aortic atherosclerosis. No enlarged abdominal or
pelvic lymph nodes.

Reproductive: Leiomyomatous uterus.

Other: Small wide neck fat and bowel containing periumbilical
anterior abdominal wall hernia. No evidence of strangulation. No
evidence of abdominal ascites.

Musculoskeletal: Lower lumbosacral spine fusion with intact
hardware.
IMPRESSION: Bilateral inflammatory perinephric fat stranding, much more
prominent on the left. Large nonobstructive 2.1 cm calculus in the
upper pole of the left kidney.. No evidence of hydronephrosis or
hydroureter.

Small wide neck fat and bowel containing periumbilical anterior
abdominal wall hernia. No evidence of strangulation.

Bilateral lower lobe pulmonary atelectasis versus airspace
consolidation.

Leiomyomatous uterus.

Intact lower lumbosacral spine fusion.
